# Patient Record
Sex: Male | Born: 1955
Health system: Southern US, Community
[De-identification: ages and names within clinical notes are randomized; demographics above are authoritative.]

## PROBLEM LIST (undated history)

## (undated) DIAGNOSIS — I10 Essential (primary) hypertension: Secondary | ICD-10-CM

## (undated) DIAGNOSIS — F32A Depression, unspecified: Secondary | ICD-10-CM

## (undated) DIAGNOSIS — F329 Major depressive disorder, single episode, unspecified: Secondary | ICD-10-CM

## (undated) DIAGNOSIS — E785 Hyperlipidemia, unspecified: Secondary | ICD-10-CM

## (undated) DIAGNOSIS — J45909 Unspecified asthma, uncomplicated: Secondary | ICD-10-CM

## (undated) DIAGNOSIS — E119 Type 2 diabetes mellitus without complications: Secondary | ICD-10-CM

## (undated) HISTORY — DX: Depression, unspecified: F32.A

## (undated) HISTORY — DX: Unspecified asthma, uncomplicated: J45.909

## (undated) HISTORY — PX: OTHER SURGICAL HISTORY: SHX169

## (undated) HISTORY — PX: LEG SURGERY: SHX1003

## (undated) HISTORY — DX: Major depressive disorder, single episode, unspecified: F32.9

---

## 2001-04-07 ENCOUNTER — Emergency Department (HOSPITAL_COMMUNITY): Admission: EM | Admit: 2001-04-07 | Discharge: 2001-04-07 | Payer: Self-pay | Admitting: Emergency Medicine

## 2001-04-07 ENCOUNTER — Encounter: Payer: Self-pay | Admitting: Emergency Medicine

## 2005-01-14 ENCOUNTER — Emergency Department (HOSPITAL_COMMUNITY): Admission: EM | Admit: 2005-01-14 | Discharge: 2005-01-14 | Payer: Self-pay | Admitting: Emergency Medicine

## 2008-10-08 ENCOUNTER — Emergency Department (HOSPITAL_COMMUNITY): Admission: EM | Admit: 2008-10-08 | Discharge: 2008-10-08 | Payer: Self-pay | Admitting: Emergency Medicine

## 2010-01-24 ENCOUNTER — Emergency Department (HOSPITAL_COMMUNITY): Admission: EM | Admit: 2010-01-24 | Discharge: 2010-01-24 | Payer: Self-pay | Admitting: Family Medicine

## 2010-04-04 ENCOUNTER — Encounter (INDEPENDENT_AMBULATORY_CARE_PROVIDER_SITE_OTHER): Payer: Self-pay | Admitting: *Deleted

## 2010-04-04 LAB — CONVERTED CEMR LAB
ALT: 11 units/L (ref 0–53)
AST: 15 units/L (ref 0–37)
Albumin: 4.4 g/dL (ref 3.5–5.2)
Alkaline Phosphatase: 82 units/L (ref 39–117)
BUN: 11 mg/dL (ref 6–23)
Basophils Absolute: 0 10*3/uL (ref 0.0–0.1)
Basophils Relative: 0 % (ref 0–1)
CO2: 29 meq/L (ref 19–32)
Calcium: 9.2 mg/dL (ref 8.4–10.5)
Chloride: 103 meq/L (ref 96–112)
Cholesterol: 165 mg/dL (ref 0–200)
Creatinine, Ser: 0.96 mg/dL (ref 0.40–1.50)
Eosinophils Absolute: 0.1 10*3/uL (ref 0.0–0.7)
Eosinophils Relative: 1 % (ref 0–5)
Folate: 10.3 ng/mL
Glucose, Bld: 66 mg/dL — ABNORMAL LOW (ref 70–99)
HCT: 44 % (ref 39.0–52.0)
HDL: 48 mg/dL (ref >39)
Hemoglobin: 14.1 g/dL (ref 13.0–17.0)
LDL Cholesterol: 103 mg/dL — ABNORMAL HIGH (ref 0–99)
Lymphocytes Relative: 17 % (ref 12–46)
Lymphs Abs: 1.5 10*3/uL (ref 0.7–4.0)
MCHC: 32 g/dL (ref 30.0–36.0)
MCV: 92.2 fL (ref 78.0–100.0)
Magnesium: 2.3 mg/dL (ref 1.5–2.5)
Monocytes Absolute: 0.7 10*3/uL (ref 0.1–1.0)
Monocytes Relative: 8 % (ref 3–12)
Neutro Abs: 6.4 10*3/uL (ref 1.7–7.7)
Neutrophils Relative %: 74 % (ref 43–77)
Platelets: 335 10*3/uL (ref 150–400)
Potassium: 4 meq/L (ref 3.5–5.3)
RBC: 4.77 M/uL (ref 4.22–5.81)
RDW: 13.5 % (ref 11.5–15.5)
Sodium: 143 meq/L (ref 135–145)
TSH: 0.435 microintl units/mL (ref 0.350–4.500)
Total Bilirubin: 0.4 mg/dL (ref 0.3–1.2)
Total CHOL/HDL Ratio: 3.4
Total Protein: 6.9 g/dL (ref 6.0–8.3)
Triglycerides: 69 mg/dL (ref <150)
VLDL: 14 mg/dL (ref 0–40)
Vitamin B-12: 356 pg/mL (ref 211–911)
WBC: 8.7 10*3/uL (ref 4.0–10.5)

## 2010-06-24 ENCOUNTER — Encounter (INDEPENDENT_AMBULATORY_CARE_PROVIDER_SITE_OTHER): Payer: Self-pay | Admitting: *Deleted

## 2010-06-24 LAB — CONVERTED CEMR LAB
ALT: 26 units/L (ref 0–53)
AST: 24 units/L (ref 0–37)
Albumin: 4.6 g/dL (ref 3.5–5.2)
Alkaline Phosphatase: 64 units/L (ref 39–117)
BUN: 11 mg/dL (ref 6–23)
Basophils Absolute: 0 10*3/uL (ref 0.0–0.1)
Basophils Relative: 0 % (ref 0–1)
CO2: 25 meq/L (ref 19–32)
Calcium: 9.7 mg/dL (ref 8.4–10.5)
Chloride: 101 meq/L (ref 96–112)
Creatinine, Ser: 0.97 mg/dL (ref 0.40–1.50)
Eosinophils Absolute: 0.1 10*3/uL (ref 0.0–0.7)
Eosinophils Relative: 2 % (ref 0–5)
Folate: >20 ng/mL
Glucose, Bld: 95 mg/dL (ref 70–99)
HCT: 46.6 % (ref 39.0–52.0)
HIV: NONREACTIVE
Hemoglobin: 15.5 g/dL (ref 13.0–17.0)
Lymphocytes Relative: 33 % (ref 12–46)
Lymphs Abs: 1.6 10*3/uL (ref 0.7–4.0)
MCHC: 33.3 g/dL (ref 30.0–36.0)
MCV: 88.3 fL (ref 78.0–100.0)
Magnesium: 2.2 mg/dL (ref 1.5–2.5)
Monocytes Absolute: 0.5 10*3/uL (ref 0.1–1.0)
Monocytes Relative: 11 % (ref 3–12)
Neutro Abs: 2.7 10*3/uL (ref 1.7–7.7)
Neutrophils Relative %: 54 % (ref 43–77)
Platelets: 259 10*3/uL (ref 150–400)
Potassium: 4.4 meq/L (ref 3.5–5.3)
RBC: 5.28 M/uL (ref 4.22–5.81)
RDW: 14.1 % (ref 11.5–15.5)
Sodium: 141 meq/L (ref 135–145)
TSH: 1.397 microintl units/mL (ref 0.350–4.500)
Total Bilirubin: 0.4 mg/dL (ref 0.3–1.2)
Total Protein: 7.4 g/dL (ref 6.0–8.3)
Vitamin B-12: 514 pg/mL (ref 211–911)
WBC: 5 10*3/uL (ref 4.0–10.5)

## 2010-08-26 LAB — CBC
HCT: 43 % (ref 39.0–52.0)
Hemoglobin: 15 g/dL (ref 13.0–17.0)
MCHC: 35 g/dL (ref 30.0–36.0)
MCV: 89.2 fL (ref 78.0–100.0)
Platelets: 231 10*3/uL (ref 150–400)
RBC: 4.82 MIL/uL (ref 4.22–5.81)
RDW: 13.8 % (ref 11.5–15.5)
WBC: 6.9 10*3/uL (ref 4.0–10.5)

## 2010-08-26 LAB — DIFFERENTIAL
Eosinophils Absolute: 0.1 10*3/uL (ref 0.0–0.7)
Eosinophils Relative: 1 % (ref 0–5)
Lymphocytes Relative: 18 % (ref 12–46)
Lymphs Abs: 1.2 10*3/uL (ref 0.7–4.0)
Monocytes Relative: 7 % (ref 3–12)

## 2010-08-26 LAB — POCT I-STAT, CHEM 8
BUN: 8 mg/dL (ref 6–23)
Creatinine, Ser: 1.5 mg/dL (ref 0.4–1.5)
Glucose, Bld: 98 mg/dL (ref 70–99)
Hemoglobin: 15.3 g/dL (ref 13.0–17.0)
TCO2: 24 mmol/L (ref 0–100)

## 2010-10-10 ENCOUNTER — Inpatient Hospital Stay (INDEPENDENT_AMBULATORY_CARE_PROVIDER_SITE_OTHER)
Admission: RE | Admit: 2010-10-10 | Discharge: 2010-10-10 | Disposition: A | Discharge status: Home / Self Care | Payer: Self-pay | Source: Ambulatory Visit | Attending: Family Medicine | Admitting: Family Medicine

## 2010-10-10 DIAGNOSIS — I10 Essential (primary) hypertension: Secondary | ICD-10-CM

## 2010-10-10 DIAGNOSIS — R6889 Other general symptoms and signs: Secondary | ICD-10-CM

## 2013-01-20 ENCOUNTER — Emergency Department (HOSPITAL_COMMUNITY): Payer: No Typology Code available for payment source

## 2013-01-20 ENCOUNTER — Encounter (HOSPITAL_COMMUNITY): Payer: Self-pay | Admitting: *Deleted

## 2013-01-20 ENCOUNTER — Emergency Department (HOSPITAL_COMMUNITY)
Admission: EM | Admit: 2013-01-20 | Discharge: 2013-01-20 | Disposition: A | Discharge status: Home / Self Care | Payer: No Typology Code available for payment source | Attending: Emergency Medicine | Admitting: Emergency Medicine

## 2013-01-20 DIAGNOSIS — S1093XA Contusion of unspecified part of neck, initial encounter: Secondary | ICD-10-CM

## 2013-01-20 DIAGNOSIS — S0003XA Contusion of scalp, initial encounter: Secondary | ICD-10-CM | POA: Insufficient documentation

## 2013-01-20 DIAGNOSIS — Z87891 Personal history of nicotine dependence: Secondary | ICD-10-CM | POA: Insufficient documentation

## 2013-01-20 DIAGNOSIS — E119 Type 2 diabetes mellitus without complications: Secondary | ICD-10-CM | POA: Insufficient documentation

## 2013-01-20 DIAGNOSIS — I10 Essential (primary) hypertension: Secondary | ICD-10-CM | POA: Insufficient documentation

## 2013-01-20 DIAGNOSIS — IMO0002 Reserved for concepts with insufficient information to code with codable children: Secondary | ICD-10-CM | POA: Insufficient documentation

## 2013-01-20 DIAGNOSIS — Y939 Activity, unspecified: Secondary | ICD-10-CM | POA: Insufficient documentation

## 2013-01-20 DIAGNOSIS — Y92410 Unspecified street and highway as the place of occurrence of the external cause: Secondary | ICD-10-CM | POA: Insufficient documentation

## 2013-01-20 DIAGNOSIS — S20219A Contusion of unspecified front wall of thorax, initial encounter: Secondary | ICD-10-CM | POA: Insufficient documentation

## 2013-01-20 DIAGNOSIS — S20229A Contusion of unspecified back wall of thorax, initial encounter: Secondary | ICD-10-CM | POA: Insufficient documentation

## 2013-01-20 HISTORY — DX: Type 2 diabetes mellitus without complications: E11.9

## 2013-01-20 HISTORY — DX: Essential (primary) hypertension: I10

## 2013-01-20 HISTORY — DX: Hyperlipidemia, unspecified: E78.5

## 2013-01-20 MED ORDER — ACETAMINOPHEN 500 MG PO TABS
1000.0000 mg | ORAL_TABLET | Freq: Once | ORAL | Status: AC
  Administered 2013-01-20: 1000 mg via ORAL
  Filled 2013-01-20: qty 2
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
## 2013-01-20 NOTE — ED Notes (Signed)
Patient transported to X-ray 
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
## 2016-09-23 ENCOUNTER — Encounter: Payer: Self-pay | Admitting: Family

## 2016-09-23 ENCOUNTER — Other Ambulatory Visit: Payer: Self-pay | Admitting: Family

## 2016-09-23 ENCOUNTER — Ambulatory Visit (INDEPENDENT_AMBULATORY_CARE_PROVIDER_SITE_OTHER): Payer: Self-pay | Admitting: Family

## 2016-09-23 ENCOUNTER — Telehealth: Payer: Self-pay | Admitting: Family

## 2016-09-23 ENCOUNTER — Other Ambulatory Visit (INDEPENDENT_AMBULATORY_CARE_PROVIDER_SITE_OTHER): Payer: Self-pay

## 2016-09-23 VITALS — BP 126/64 | HR 67 | Temp 98.0°F | Resp 16 | Ht 68.0 in | Wt 210.4 lb

## 2016-09-23 DIAGNOSIS — E782 Mixed hyperlipidemia: Secondary | ICD-10-CM

## 2016-09-23 DIAGNOSIS — E119 Type 2 diabetes mellitus without complications: Secondary | ICD-10-CM

## 2016-09-23 DIAGNOSIS — I10 Essential (primary) hypertension: Secondary | ICD-10-CM | POA: Insufficient documentation

## 2016-09-23 DIAGNOSIS — E785 Hyperlipidemia, unspecified: Secondary | ICD-10-CM

## 2016-09-23 HISTORY — DX: Hyperlipidemia, unspecified: E78.5

## 2016-09-23 HISTORY — DX: Type 2 diabetes mellitus without complications: E11.9

## 2016-09-23 HISTORY — DX: Essential (primary) hypertension: I10

## 2016-09-23 LAB — LIPID PANEL
CHOL/HDL RATIO: 4
Cholesterol: 152 mg/dL (ref 0–200)
HDL: 39.2 mg/dL (ref >39.00)
LDL CALC: 78 mg/dL (ref 0–99)
NONHDL: 113.25
Triglycerides: 176 mg/dL — ABNORMAL HIGH (ref 0.0–149.0)
VLDL: 35.2 mg/dL (ref 0.0–40.0)

## 2016-09-23 LAB — COMPREHENSIVE METABOLIC PANEL
ALBUMIN: 4.2 g/dL (ref 3.5–5.2)
ALK PHOS: 58 U/L (ref 39–117)
ALT: 15 U/L (ref 0–53)
AST: 15 U/L (ref 0–37)
BILIRUBIN TOTAL: 0.3 mg/dL (ref 0.2–1.2)
BUN: 13 mg/dL (ref 6–23)
CALCIUM: 9.6 mg/dL (ref 8.4–10.5)
CO2: 29 mEq/L (ref 19–32)
CREATININE: 1.13 mg/dL (ref 0.40–1.50)
Chloride: 104 mEq/L (ref 96–112)
GFR: 85.05 mL/min (ref >60.00)
Glucose, Bld: 132 mg/dL — ABNORMAL HIGH (ref 70–99)
Potassium: 4.5 mEq/L (ref 3.5–5.1)
Sodium: 139 mEq/L (ref 135–145)
Total Protein: 6.8 g/dL (ref 6.0–8.3)

## 2016-09-23 LAB — HEMOGLOBIN A1C: HEMOGLOBIN A1C: 7.7 % — AB (ref 4.6–6.5)

## 2016-09-23 LAB — MICROALBUMIN / CREATININE URINE RATIO
Creatinine,U: 238.1 mg/dL
MICROALB UR: 1.3 mg/dL (ref 0.0–1.9)
Microalb Creat Ratio: 0.5 mg/g (ref 0.0–30.0)

## 2016-09-23 MED ORDER — ROSUVASTATIN CALCIUM 20 MG PO TABS: 20.0000 mg | ORAL_TABLET | Freq: Every day | ORAL | 2 refills | Status: DC

## 2016-09-23 MED ORDER — AMLODIPINE BESYLATE 10 MG PO TABS: 10.0000 mg | ORAL_TABLET | Freq: Every day | ORAL | 0 refills | Status: DC

## 2016-09-23 MED ORDER — METFORMIN HCL 1000 MG PO TABS: 1000.0000 mg | ORAL_TABLET | Freq: Two times a day (BID) | ORAL | 2 refills | Status: DC

## 2016-09-23 NOTE — Telephone Encounter (Signed)
Lajuan Lines sister, said that pt is no longer on Sertraline 100mg  and all scripts need to be sent to [facility]  Phone number [phone]  Fax number [phone]

## 2016-09-23 NOTE — Patient Instructions (Signed)
Thank you for choosing [facility].  SUMMARY AND INSTRUCTIONS:  Please continue to take your medications as prescribed.  Work on a modified carbohydrate intake avoiding too many processed/sugary foods.   We will check your blood work and make adjustments to your medication.   Please schedule a time for your annual exam at your convenience.    Labs:  Please stop by the lab on the lower level of the building for your blood work. Your results will be released to [place] (or called to you) after review, usually within 72 hours after test completion. If any changes need to be made, you will be notified at that same time.  1.) The lab is open from 7:30am to 5:30 pm Monday-Friday 2.) No appointment is necessary 3.) Fasting (if needed) is 6-8 hours after food and drink; black coffee and water are okay   Follow up:  If your symptoms worsen or fail to improve, please contact our office for further instruction, or in case of emergency go directly to the emergency room at the closest medical facility.   DIABETES CARE INSTRUCTIONS:  Current A1c: No results found for: HGBA1C  A1C Goal: <7.0%  Fasting Blood Sugar Goal: 80-130 Blood sugar check that you take after fasting for at least 8 hours which is usually in the morning before eating.  Post-prandial Blood Sugar Goal: <180 Blood sugar check approximately 1-2 hours after the start of a meal.    Diabetes Prevention Screens:  1.) Ensure you have an annual eye exam by an ophthalmologist or optometrist.   2,) Ensure you have an annual foot exam or when any changes are noted including new onset numbness/tingling or wounds. Check your feet daily!  3.) The American Diabetes Association recommends the Pneumovax vaccination against pneumonia every 5 years.  4.) We will check your kidney function with a urine test on an annual basis.

## 2016-09-23 NOTE — Assessment & Plan Note (Signed)
Obtain lipid profile. Currently maintained on simvastatin with no adverse side effects. Continue current dosage of simvastatin pending lipid profile results.

## 2016-09-23 NOTE — Assessment & Plan Note (Signed)
Type 2 diabetes appears adequately controlled based on blood sugar readings generally less than 150. Denies adverse side effects or hypoglycemic readings. Obtain A1c, urine microalbumin, and CMET. Diabetic foot exam completed today. On simvastatin for CAD risk reduction. Will request records for Pneumovax status. Encouraged to continue to monitor blood sugars at home 1-2 times daily. Follow up and medication changes pending A1c results.

## 2016-09-23 NOTE — Telephone Encounter (Signed)
LVM with sister letting her know that cholesterol med and diabetes meds will not be sent in until we get pts lab results. They were informed during visit that [facility] will not fill those medications until we get blood work. BP meds were sent in.

## 2016-09-23 NOTE — Progress Notes (Signed)
Subjective:    Patient ID: Benjamin Serrano, male    DOB: 14-Feb-1956, 61 y.o.   MRN: [id number]  Chief Complaint  Patient presents with  . Establish Care    Diabetes and cholesterol check     HPI:  Benjamin Serrano is a 61 y.o. male who  has a past medical history of Depression; Diabetes mellitus without complication ([place]); Hyperlipemia; and Hypertension. and presents today for an office visit to establish care. Patient has some mild memory impairment and is a mildly poor historian.   1.) Diabetes - Currently prescribed glipizide and metformin. Reports taking the medication as prescribed and denies adverse side effects or hypoglycemic readings. Blood sugars at home . Denies new symptoms of end organ damage. No excessive hunger, thirst, or urination. Working on a low/carbohydrate modified oral intake. Scheduled for an eye exam.   Lab Results  Component Value Date   HGBA1C 7.7 (H) 09/23/2016     Lab Results  Component Value Date   CREATININE 1.13 09/23/2016   BUN 13 09/23/2016   NA 139 09/23/2016   K 4.5 09/23/2016   CL 104 09/23/2016   CO2 29 09/23/2016    2.) Cholesterol - Currently prescribed simvastatin. Reports taking the medication as prescribed and denies adverse side effects or myalgias. No current cardiac symptoms. Continues to work on improving nutritional quality.   Lab Results  Component Value Date   CHOL 152 09/23/2016   HDL 39.20 09/23/2016   LDLCALC 78 09/23/2016   TRIG 176.0 (H) 09/23/2016   CHOLHDL 4 09/23/2016    3.) Hypertension - Currently maintained on amlodipine  and reports taking the medications as prescribed and denies adverse side effects or hypotensive readings. Blood pressures at home have been adequately controlled. Denies changes in vision, worst headache of life or new symptoms of end organ damage. Working on following a low sodium diet. Physical activity level   BP Readings from Last 3 Encounters:  09/23/16 126/64  01/20/13 147/81     No Known Allergies    Outpatient Medications Prior to Visit  Medication Sig Dispense Refill  . fish oil-omega-3 fatty acids 1000 MG capsule Take 1 g by mouth 2 (two) times daily.    Marland Kitchen glipiZIDE (GLUCOTROL) 10 MG tablet Take 10 mg by mouth daily.     No facility-administered medications prior to visit.      Past Medical History:  Diagnosis Date  . Depression   . Diabetes mellitus without complication ([place])   . Hyperlipemia   . Hypertension       Past Surgical History:  Procedure Laterality Date  . Arm surgery Right   . LEG SURGERY        Family History  Problem Relation Age of Onset  . Diabetes Mother   . Alzheimer's disease Maternal Grandmother   . Hypertension Maternal Grandmother       Social History   Social History  . Marital status: Widowed    Spouse name: N/A  . Number of children: 1  . Years of education: 9   Occupational History  . Disability     Mental health   Social History Main Topics  . Smoking status: Current Every Day Smoker    Packs/day: 0.25    Types: Cigarettes  . Smokeless tobacco: Never Used  . Alcohol use No  . Drug use: No  . Sexual activity: Not on file   Other Topics Concern  . Not on file   Social History Narrative  Fun/Hobby: Paint and art.       Review of Systems  Constitutional: Negative for chills and fever.  Eyes:       Negative for changes in vision  Respiratory: Negative for cough, chest tightness, shortness of breath and wheezing.   Cardiovascular: Negative for chest pain, palpitations and leg swelling.  Endocrine: Negative for polydipsia, polyphagia and polyuria.  Neurological: Negative for dizziness, weakness, light-headedness and headaches.       Objective:    BP 126/64 (BP Location: Left Arm, Patient Position: Sitting, Cuff Size: Large)   Pulse 67   Temp 98 F (36.7 C) (Oral)   Resp 16   Ht 5\' 8"  (1.727 m)   Wt 210 lb 6.4 oz (95.4 kg)   SpO2 96%   BMI 31.99 kg/m  Nursing note and  vital signs reviewed.  Physical Exam  Constitutional: He is oriented to person, place, and time. He appears well-developed and well-nourished. No distress.  Cardiovascular: Normal rate, regular rhythm, normal heart sounds and intact distal pulses.   Pulmonary/Chest: Effort normal and breath sounds normal.  Neurological: He is alert and oriented to person, place, and time.  Skin: Skin is warm and dry.  Psychiatric: He has a normal mood and affect. His speech is normal and behavior is normal. Judgment and thought content normal. Cognition and memory are impaired.        Assessment & Plan:   Problem List Items Addressed This Visit      Cardiovascular and Mediastinum   Essential hypertension    Blood pressure appears adequately controlled with current medication regimen below goal 140/90 with no adverse side effects or hypotensive readings. Denies worse headache of life with no symptoms of end organ damage noted on physical exam. Continue current dosage of amlodipine. Encouraged to monitor blood pressure at home and follow sodium diet.      Relevant Medications   simvastatin (ZOCOR) 40 MG tablet   amLODipine (NORVASC) 10 MG tablet     Endocrine   Type 2 diabetes mellitus (HCC) - Primary    Type 2 diabetes appears adequately controlled based on blood sugar readings generally less than 150. Denies adverse side effects or hypoglycemic readings. Obtain A1c, urine microalbumin, and CMET. Diabetic foot exam completed today. On simvastatin for CAD risk reduction. Will request records for Pneumovax status. Encouraged to continue to monitor blood sugars at home 1-2 times daily. Follow up and medication changes pending A1c results.       Relevant Medications   metFORMIN (GLUCOPHAGE) 500 MG tablet   simvastatin (ZOCOR) 40 MG tablet   Other Relevant Orders   Comprehensive metabolic panel (Completed)   Hemoglobin A1c (Completed)   Urine Microalbumin w/creat. ratio (Completed)     Other    Hyperlipidemia    Obtain lipid profile. Currently maintained on simvastatin with no adverse side effects. Continue current dosage of simvastatin pending lipid profile results.      Relevant Medications   simvastatin (ZOCOR) 40 MG tablet   amLODipine (NORVASC) 10 MG tablet   Other Relevant Orders   Lipid Profile (Completed)       I have changed Mr. Taney's amLODipine. I am also having him maintain his glipiZIDE, fish oil-omega-3 fatty acids, metFORMIN, and simvastatin.   Meds ordered this encounter  Medications  . metFORMIN (GLUCOPHAGE) 500 MG tablet    Sig: Take 500 mg by mouth 2 (two) times daily with a meal.  . DISCONTD: amLODipine (NORVASC) 10 MG tablet    Sig: Take  10 mg by mouth daily.  Marland Kitchen DISCONTD: sertraline (ZOLOFT) 100 MG tablet    Sig: Take 100 mg by mouth daily.  . simvastatin (ZOCOR) 40 MG tablet    Sig: Take 40 mg by mouth daily.  Marland Kitchen amLODipine (NORVASC) 10 MG tablet    Sig: Take 1 tablet (10 mg total) by mouth daily.    Dispense:  90 tablet    Refill:  0     Follow-up: Return in about 1 month (around 10/24/2016), or if symptoms worsen or fail to improve.  Mauricio Po, FNP

## 2016-09-23 NOTE — Telephone Encounter (Signed)
Pt wanted to let us know that he is completely out of simvastatin (ZOCOR) 40 MG tablet and needs this to be sent as soon as possible.

## 2016-09-23 NOTE — Assessment & Plan Note (Signed)
Blood pressure appears adequately controlled with current medication regimen below goal 140/90 with no adverse side effects or hypotensive readings. Denies worse headache of life with no symptoms of end organ damage noted on physical exam. Continue current dosage of amlodipine. Encouraged to monitor blood pressure at home and follow sodium diet.

## 2016-09-24 ENCOUNTER — Telehealth: Payer: Self-pay | Admitting: Family

## 2016-09-24 NOTE — Telephone Encounter (Signed)
Pt aware of results 

## 2016-09-24 NOTE — Telephone Encounter (Signed)
Please call back with test results.

## 2016-11-30 ENCOUNTER — Other Ambulatory Visit: Payer: Self-pay | Admitting: Family

## 2016-11-30 ENCOUNTER — Telehealth: Payer: Self-pay | Admitting: Family

## 2016-11-30 MED ORDER — GLUCOSE BLOOD VI STRP: ORAL_STRIP | 12 refills | Status: DC

## 2016-11-30 MED ORDER — LANCETS MISC: 2 refills | Status: DC

## 2016-11-30 NOTE — Telephone Encounter (Signed)
Requesting scripts for lancets and test strips to be sent to [facility] on [address].  Sister will call back with info on meter.

## 2016-11-30 NOTE — Telephone Encounter (Signed)
Test strips and lancets have been sent  

## 2016-11-30 NOTE — Telephone Encounter (Signed)
The name of the meter True Matrix Meprix

## 2017-02-01 ENCOUNTER — Other Ambulatory Visit: Payer: Self-pay | Admitting: Family

## 2017-02-01 NOTE — Telephone Encounter (Signed)
Pt sister called regarding this.

## 2017-02-02 ENCOUNTER — Telehealth: Payer: Self-pay | Admitting: Family

## 2017-02-02 MED ORDER — GLUCOSE BLOOD VI STRP: 1.0000 | ORAL_STRIP | Freq: Two times a day (BID) | 0 refills | Status: DC

## 2017-02-02 NOTE — Telephone Encounter (Signed)
Pt is needing a refill on his glucose blood (TRUE METRIX BLOOD GLUCOSE TEST) test strips sent to [facility] on [address].

## 2017-02-02 NOTE — Telephone Encounter (Signed)
Pt is due for follow-up appt sent 30 day script until appt...Benjamin Serrano

## 2017-02-03 NOTE — Telephone Encounter (Signed)
Pt called back in said that pharmacy never got his med?  Can this be resent?

## 2017-02-04 ENCOUNTER — Telehealth: Payer: Self-pay | Admitting: Family

## 2017-02-04 MED ORDER — GLUCOSE BLOOD VI STRP: 1.0000 | ORAL_STRIP | Freq: Two times a day (BID) | 0 refills | Status: DC

## 2017-02-04 NOTE — Addendum Note (Signed)
Addended by: Earnstine Regal on: 02/04/2017 09:33 AM   Modules accepted: Orders

## 2017-02-04 NOTE — Telephone Encounter (Signed)
Resent rx for strips to [facility]...Benjamin Serrano

## 2017-02-04 NOTE — Telephone Encounter (Signed)
States testing strips was to have some kind of paper work filled out with insurance in order to get filled.  Insurance company states wrong date was not right - should have been 12/16/2016 and there was something not right with A1C section.  Is requesting call back in regard to this.

## 2017-02-04 NOTE — Telephone Encounter (Signed)
I have added a1c number and date changed to 12/16/16---refaxed again to [facility], calling to confirm receipt

## 2017-02-04 NOTE — Telephone Encounter (Signed)
Per [facility] pharmacist, paper has been recd via fax with all right information---cost for patient is $3---I have advised patient's daughter that strips are ready for pick up per [facility] pharmacy

## 2017-02-22 ENCOUNTER — Other Ambulatory Visit (INDEPENDENT_AMBULATORY_CARE_PROVIDER_SITE_OTHER): Payer: Medicare Other

## 2017-02-22 ENCOUNTER — Encounter: Payer: Self-pay | Admitting: Family

## 2017-02-22 ENCOUNTER — Ambulatory Visit (INDEPENDENT_AMBULATORY_CARE_PROVIDER_SITE_OTHER): Payer: Medicare Other | Admitting: Family

## 2017-02-22 VITALS — BP 112/72 | HR 63 | Temp 98.4°F | Resp 16 | Ht 68.0 in | Wt 199.4 lb

## 2017-02-22 DIAGNOSIS — E119 Type 2 diabetes mellitus without complications: Secondary | ICD-10-CM

## 2017-02-22 DIAGNOSIS — E782 Mixed hyperlipidemia: Secondary | ICD-10-CM | POA: Diagnosis not present

## 2017-02-22 DIAGNOSIS — I10 Essential (primary) hypertension: Secondary | ICD-10-CM | POA: Diagnosis not present

## 2017-02-22 LAB — HEMOGLOBIN A1C: HEMOGLOBIN A1C: 7.5 % — AB (ref 4.6–6.5)

## 2017-02-22 MED ORDER — METFORMIN HCL 1000 MG PO TABS: 1000.0000 mg | ORAL_TABLET | Freq: Two times a day (BID) | ORAL | 1 refills | Status: DC

## 2017-02-22 MED ORDER — AMLODIPINE BESYLATE 10 MG PO TABS: 10.0000 mg | ORAL_TABLET | Freq: Every day | ORAL | 1 refills | Status: DC

## 2017-02-22 MED ORDER — GLIPIZIDE 10 MG PO TABS: 10.0000 mg | ORAL_TABLET | Freq: Every day | ORAL | 1 refills | Status: DC

## 2017-02-22 MED ORDER — ROSUVASTATIN CALCIUM 20 MG PO TABS: 20.0000 mg | ORAL_TABLET | Freq: Every day | ORAL | 1 refills | Status: DC

## 2017-02-22 MED ORDER — GLUCOSE BLOOD VI STRP: 1.0000 | ORAL_STRIP | Freq: Two times a day (BID) | 3 refills | Status: DC

## 2017-02-22 NOTE — Assessment & Plan Note (Signed)
Blood pressure appears well controlled and below goal 140/90 with current medication regimen and no adverse side effects. No symptoms of end organ damage noted on physical exam. Continue current dosage of amlodipine. Encouraged to continue monitoring blood pressure at home and follow low-sodium diet.

## 2017-02-22 NOTE — Patient Instructions (Signed)
Thank you for choosing [facility].  SUMMARY AND INSTRUCTIONS:  Please continue to take your medications as prescribed.   Continue to monitor your blood sugars at home 2x daily.   Follow a low sodium diet and monitor your blood pressure at home daily. Average goal below 140/90.  Folllow up pending your A1c results in 3-6 months.   Medication:  Your prescription(s) have been submitted to your pharmacy or been printed and provided for you. Please take as directed and contact our office if you believe you are having problem(s) with the medication(s) or have any questions.  Labs:  Please stop by the lab on the lower level of the building for your blood work. Your results will be released to [facility] (or called to you) after review, usually within 72 hours after test completion. If any changes need to be made, you will be notified at that same time.  1.) The lab is open from 7:30am to 5:30 pm Monday-Friday 2.) No appointment is necessary 3.) Fasting (if needed) is 6-8 hours after food and drink; black coffee and water are okay   Follow up:  If your symptoms worsen or fail to improve, please contact our office for further instruction, or in case of emergency go directly to the emergency room at the closest medical facility.

## 2017-02-22 NOTE — Progress Notes (Signed)
Subjective:    Patient ID: Benjamin Serrano, male    DOB: [birth year], 61 y.o.   MRN: [id number]  Chief Complaint  Patient presents with  . Medication Refill    refills on all meds    HPI:  Benjamin Serrano is a 61 y.o. male who  has a past medical history of Depression; Diabetes mellitus without complication ([place]); Hyperlipemia; and Hypertension. and presents today for a follow up office visit.    1.) Type 2 diabetes - Currently prescribed glipizide and metformin. Reports taking the medication as prescribed and denies adverse side effects or hypoglycemic readings. Blood sugars at home have been labile  . Denies new symptoms of end organ damage. No excessive hunger, thirst, or urination. Working on a low/carbohydrate modified oral intake. Physical activity  Lab Results  Component Value Date   HGBA1C 7.5 (H) 02/22/2017     Lab Results  Component Value Date   CREATININE 1.13 09/23/2016   BUN 13 09/23/2016   NA 139 09/23/2016   K 4.5 09/23/2016   CL 104 09/23/2016   CO2 29 09/23/2016   2.) Hypertension - Currently prescribed amlodipine. Reports taking the medications as prescribed and denies adverse side effects or hypotensive readings. Blood pressures at home have remained below goal and well controlled. Denies changes in vision, worst headache of life or new symptoms of end organ damage. Working on following a low sodium diet.   BP Readings from Last 3 Encounters:  02/22/17 112/72  09/23/16 126/64  01/20/13 147/81    3.) Hyperlipidemia - Currently prescribed rosuvastain. Reports taking the medication as prescribed and denies adverse side effects or myalgias.   Lab Results  Component Value Date   CHOL 152 09/23/2016   HDL 39.20 09/23/2016   LDLCALC 78 09/23/2016   TRIG 176.0 (H) 09/23/2016   CHOLHDL 4 09/23/2016      No Known Allergies    Outpatient Medications Prior to Visit  Medication Sig Dispense Refill  . fish oil-omega-3 fatty acids 1000 MG capsule  Take 1 g by mouth 2 (two) times daily.    . Lancets MISC Use to check blood sugars 1-4 times daily. 100 each 2  . amLODipine (NORVASC) 10 MG tablet Take 1 tablet (10 mg total) by mouth daily. 90 tablet 0  . glipiZIDE (GLUCOTROL) 10 MG tablet Take 10 mg by mouth daily.    Marland Kitchen glucose blood (TRUE METRIX BLOOD GLUCOSE TEST) test strip 1 each by Other route 2 (two) times daily. Follow-up appt is due must see provider for future refills 100 each 0  . metFORMIN (GLUCOPHAGE) 1000 MG tablet TAKE 1 TABLET BY MOUTH TWICE DAILY WITH MEALS 60 tablet 0  . rosuvastatin (CRESTOR) 20 MG tablet TAKE 1 TABLET BY MOUTH DAILY 30 tablet 2  . metFORMIN (GLUCOPHAGE) 1000 MG tablet Take 1 tablet by mouth twice daily with meals. Needs office visit for more refills. 60 tablet 0   No facility-administered medications prior to visit.       Past Surgical History:  Procedure Laterality Date  . Arm surgery Right   . LEG SURGERY        Past Medical History:  Diagnosis Date  . Depression   . Diabetes mellitus without complication ([place])   . Hyperlipemia   . Hypertension       Review of Systems  Constitutional: Negative for chills and fever.  Eyes:       Negative for changes in vision  Respiratory: Negative for cough, chest  tightness, shortness of breath and wheezing.   Cardiovascular: Negative for chest pain, palpitations and leg swelling.  Endocrine: Negative for polydipsia, polyphagia and polyuria.  Neurological: Negative for dizziness, weakness, light-headedness and headaches.      Objective:    BP 112/72 (BP Location: Left Arm, Patient Position: Sitting, Cuff Size: Large)   Pulse 63   Temp 98.4 F (36.9 C) (Oral)   Resp 16   Ht 5\' 8"  (1.727 m)   Wt 199 lb 6.4 oz (90.4 kg)   SpO2 96%   BMI 30.32 kg/m  Nursing note and vital signs reviewed.  Physical Exam  Constitutional: He is oriented to person, place, and time. He appears well-developed and well-nourished. No distress.  Cardiovascular:  Normal rate, regular rhythm, normal heart sounds and intact distal pulses.  Exam reveals no gallop and no friction rub.   No murmur heard. Pulmonary/Chest: Effort normal and breath sounds normal. No respiratory distress. He has no wheezes. He has no rales. He exhibits no tenderness.  Neurological: He is alert and oriented to person, place, and time.  Skin: Skin is warm and dry.  Psychiatric: He has a normal mood and affect. His behavior is normal. Judgment and thought content normal.       Assessment & Plan:   Problem List Items Addressed This Visit      Cardiovascular and Mediastinum   Essential hypertension    Blood pressure appears well controlled and below goal 140/90 with current medication regimen and no adverse side effects. No symptoms of end organ damage noted on physical exam. Continue current dosage of amlodipine. Encouraged to continue monitoring blood pressure at home and follow low-sodium diet.      Relevant Medications   amLODipine (NORVASC) 10 MG tablet   rosuvastatin (CRESTOR) 20 MG tablet     Endocrine   Type 2 diabetes mellitus ([place]) - Primary    Home blood sugars appear stable with current medication regimen and no adverse side effects or hypoglycemic readings. Obtain hemoglobin A1c. Declines Pneumovax. Encouraged to complete diabetic eye exam independently. Urine microalbumin and diabetic foot exam completed. Maintained on rosuvastatin for CAD risk reduction. Continue current dosage of metformin and glipizide pending A1c results.      Relevant Medications   metFORMIN (GLUCOPHAGE) 1000 MG tablet   glipiZIDE (GLUCOTROL) 10 MG tablet   rosuvastatin (CRESTOR) 20 MG tablet   Other Relevant Orders   Hemoglobin A1c (Completed)     Other   Hyperlipidemia    Stable with current medication regimen and no adverse side effects or myalgias. Most recent lipid profile shows LDL of 78. Continue current dosage of rosuvastatin.      Relevant Medications   amLODipine  (NORVASC) 10 MG tablet   rosuvastatin (CRESTOR) 20 MG tablet       I have discontinued Benjamin Serrano metFORMIN. I have also changed his glucose blood, metFORMIN, glipiZIDE, and rosuvastatin. Additionally, I am having him maintain his fish oil-omega-3 fatty acids, Lancets, and amLODipine.   Meds ordered this encounter  Medications  . glucose blood (TRUE METRIX BLOOD GLUCOSE TEST) test strip    Sig: 1 each by Other route 2 (two) times daily.    Dispense:  100 each    Refill:  3  . metFORMIN (GLUCOPHAGE) 1000 MG tablet    Sig: Take 1 tablet (1,000 mg total) by mouth 2 (two) times daily with a meal.    Dispense:  180 tablet    Refill:  1    Order Specific  Question:   Supervising Provider    Answer:   Pricilla Holm A [[id number]]  . glipiZIDE (GLUCOTROL) 10 MG tablet    Sig: Take 1 tablet (10 mg total) by mouth daily.    Dispense:  90 tablet    Refill:  1    Order Specific Question:   Supervising Provider    Answer:   Pricilla Holm A [[id number]]  . amLODipine (NORVASC) 10 MG tablet    Sig: Take 1 tablet (10 mg total) by mouth daily.    Dispense:  90 tablet    Refill:  1    Order Specific Question:   Supervising Provider    Answer:   Pricilla Holm A [[id number]]  . rosuvastatin (CRESTOR) 20 MG tablet    Sig: Take 1 tablet (20 mg total) by mouth daily.    Dispense:  90 tablet    Refill:  1    Order Specific Question:   Supervising Provider    Answer:   Pricilla Holm A [[id number]]     Follow-up: Return in about 3 months (around 05/25/2017), or if symptoms worsen or fail to improve.  Benjamin Po, FNP

## 2017-02-22 NOTE — Assessment & Plan Note (Signed)
Home blood sugars appear stable with current medication regimen and no adverse side effects or hypoglycemic readings. Obtain hemoglobin A1c. Declines Pneumovax. Encouraged to complete diabetic eye exam independently. Urine microalbumin and diabetic foot exam completed. Maintained on rosuvastatin for CAD risk reduction. Continue current dosage of metformin and glipizide pending A1c results.

## 2017-02-22 NOTE — Assessment & Plan Note (Signed)
Stable with current medication regimen and no adverse side effects or myalgias. Most recent lipid profile shows LDL of 78. Continue current dosage of rosuvastatin.

## 2017-02-23 ENCOUNTER — Telehealth: Payer: Self-pay

## 2017-02-23 NOTE — Telephone Encounter (Signed)
Patient wants to transfer to [facility]. Will you call him to set up a 3 month follow up for diabetes with her please.

## 2017-02-24 NOTE — Telephone Encounter (Signed)
Got patient scheduled

## 2017-04-23 ENCOUNTER — Other Ambulatory Visit: Payer: Self-pay

## 2017-04-23 MED ORDER — GLUCOSE BLOOD VI STRP: 1.0000 | ORAL_STRIP | Freq: Two times a day (BID) | 5 refills | Status: DC

## 2017-05-07 ENCOUNTER — Telehealth: Payer: Self-pay | Admitting: Family

## 2017-05-07 MED ORDER — GLUCOSE BLOOD VI STRP: 1.0000 | ORAL_STRIP | Freq: Two times a day (BID) | 0 refills | Status: DC

## 2017-05-07 NOTE — Telephone Encounter (Signed)
Copied from [place] [id number]. Topic: Quick Communication - See Telephone Encounter >> May 07, 2017  2:04 PM Percell Belt A wrote: CRM for notification. See Telephone encounter for:  05/07/17. Pt sister called in and said that pt needs refill on his  glucose blood (TRUE METRIX BLOOD GLUCOSE TEST) test strip [[id number]]   Please sent to [facility] on [address]  She is pretty sure it is going to need a PA

## 2017-05-07 NOTE — Telephone Encounter (Signed)
SENT RX POF...Benjamin Serrano

## 2017-05-28 ENCOUNTER — Other Ambulatory Visit (INDEPENDENT_AMBULATORY_CARE_PROVIDER_SITE_OTHER): Payer: Medicare HMO

## 2017-05-28 ENCOUNTER — Ambulatory Visit (INDEPENDENT_AMBULATORY_CARE_PROVIDER_SITE_OTHER): Payer: Medicare HMO | Admitting: Nurse Practitioner

## 2017-05-28 ENCOUNTER — Encounter: Payer: Self-pay | Admitting: Nurse Practitioner

## 2017-05-28 VITALS — BP 140/80 | HR 75 | Temp 98.4°F | Resp 16 | Ht 68.0 in | Wt 206.1 lb

## 2017-05-28 DIAGNOSIS — E119 Type 2 diabetes mellitus without complications: Secondary | ICD-10-CM

## 2017-05-28 DIAGNOSIS — N529 Male erectile dysfunction, unspecified: Secondary | ICD-10-CM | POA: Diagnosis not present

## 2017-05-28 DIAGNOSIS — I10 Essential (primary) hypertension: Secondary | ICD-10-CM | POA: Diagnosis not present

## 2017-05-28 DIAGNOSIS — H938X3 Other specified disorders of ear, bilateral: Secondary | ICD-10-CM | POA: Diagnosis not present

## 2017-05-28 DIAGNOSIS — Z125 Encounter for screening for malignant neoplasm of prostate: Secondary | ICD-10-CM | POA: Diagnosis not present

## 2017-05-28 DIAGNOSIS — R3912 Poor urinary stream: Secondary | ICD-10-CM

## 2017-05-28 DIAGNOSIS — E782 Mixed hyperlipidemia: Secondary | ICD-10-CM

## 2017-05-28 LAB — COMPREHENSIVE METABOLIC PANEL
ALK PHOS: 65 U/L (ref 39–117)
ALT: 13 U/L (ref 0–53)
AST: 14 U/L (ref 0–37)
Albumin: 4.6 g/dL (ref 3.5–5.2)
BUN: 13 mg/dL (ref 6–23)
CO2: 30 meq/L (ref 19–32)
Calcium: 9.5 mg/dL (ref 8.4–10.5)
Chloride: 102 mEq/L (ref 96–112)
Creatinine, Ser: 0.98 mg/dL (ref 0.40–1.50)
GFR: 100.01 mL/min (ref >60.00)
GLUCOSE: 100 mg/dL — AB (ref 70–99)
POTASSIUM: 4 meq/L (ref 3.5–5.1)
SODIUM: 140 meq/L (ref 135–145)
Total Bilirubin: 0.4 mg/dL (ref 0.2–1.2)
Total Protein: 7.2 g/dL (ref 6.0–8.3)

## 2017-05-28 LAB — LIPID PANEL
CHOL/HDL RATIO: 3
Cholesterol: 134 mg/dL (ref 0–200)
HDL: 42.6 mg/dL (ref >39.00)
LDL CALC: 59 mg/dL (ref 0–99)
NONHDL: 91.02
Triglycerides: 158 mg/dL — ABNORMAL HIGH (ref 0.0–149.0)
VLDL: 31.6 mg/dL (ref 0.0–40.0)

## 2017-05-28 LAB — HEMOGLOBIN A1C: Hgb A1c MFr Bld: 7.2 % — ABNORMAL HIGH (ref 4.6–6.5)

## 2017-05-28 LAB — PSA: PSA: 2.73 ng/mL (ref 0.10–4.00)

## 2017-05-28 MED ORDER — GLIPIZIDE 10 MG PO TABS: 10.0000 mg | ORAL_TABLET | Freq: Every day | ORAL | 1 refills | Status: DC

## 2017-05-28 MED ORDER — METFORMIN HCL 1000 MG PO TABS: 1000.0000 mg | ORAL_TABLET | Freq: Two times a day (BID) | ORAL | 1 refills | Status: DC

## 2017-05-28 NOTE — Progress Notes (Addendum)
Subjective:    Patient ID: Benjamin Serrano, male    DOB: [birth year], 62 y.o.   MRN: [id number]  HPI Benjamin Serrano is a 62 yo male who presents today to establish care. He is accompanied today by his mother, who he lives with. He is transferring to me from another provider in the same clinic. Has the following chronic medical problems: hypertension, diabetes mellitus type 2, hyperlipidemia.   Hypertension -maintained on amlodipine 10 Reports daily medication compliance without adverse medication effects. Reports he does not check his blood pressure regularly at home Denies headaches, vision changes, chest pain, shortness of breath, edema.  BP Readings from Last 3 Encounters:  05/28/17 140/80  02/22/17 112/72  09/23/16 126/64   Cholesterol- maintained on rosuvastatin 20 Reports daily medication compliance without adverse medication effects including myalgias. Diet- home cooked meals, vegetables, baked meats Drinks- ginger ale, large jug of water daily Exercise- walks outside about 1 hour about 3 days a week  Lab Results  Component Value Date   CHOL 152 09/23/2016   HDL 39.20 09/23/2016   LDLCALC 78 09/23/2016   TRIG 176.0 (H) 09/23/2016   CHOLHDL 4 09/23/2016   Diabetes- maintained on metformin 1000 BID, glipizide 10 daily Reports daily medication compliance without adverse medication effects. Reports he checks his blood sugars twice daily at home- readings 90s-160. Denies tremor, diaphoresis, polyuria, polydipsia, polyphagia.  Lab Results  Component Value Date   HGBA1C 7.5 (H) 02/22/2017   Erectile dysfunction- This Is not a new problem. This problem has been ongoing for some time now, possibly a year or more This is his first encounter for this problem. He is unable to get an erection and feels his penis is too small. He has not tried to have intercourse because he is too embarrassed. He has the desire to have sex but is too afraid to try. He reports weakened urinary  stream, urinary freuquency. He denies dizziness, weakness, abdominal pain, pelvic pain, dysuria, hematuria, penile discharge, constipation or diarrhea.   Ear pressure- This is an acute problem. This problem began about a week ago. He describes as pressure and popping in his ears. He feels like he is in a tunnel and he can't hear good when his ears pop. He reports nasal congestion. He denies fevers, ear pain or discharge, vision changes, sore throat.  Review of Systems  See HPI  Past Medical History:  Diagnosis Date  . Depression   . Diabetes mellitus without complication ([place])   . Hyperlipemia   . Hypertension      Social History   Socioeconomic History  . Marital status: Widowed    Spouse name: Not on file  . Number of children: 1  . Years of education: 50  . Highest education level: Not on file  Social Needs  . Financial resource strain: Not on file  . Food insecurity - worry: Not on file  . Food insecurity - inability: Not on file  . Transportation needs - medical: Not on file  . Transportation needs - non-medical: Not on file  Occupational History  . Occupation: Disability    Comment: Mental health  Tobacco Use  . Smoking status: Current Every Day Smoker    Packs/day: 0.25    Types: Cigarettes  . Smokeless tobacco: Never Used  Substance and Sexual Activity  . Alcohol use: No    Comment: social  . Drug use: No  . Sexual activity: Not on file  Other Topics Concern  . Not on  file  Social History Narrative   Fun/Hobby: Oceanographer.     Past Surgical History:  Procedure Laterality Date  . Arm surgery Right   . LEG SURGERY      Family History  Problem Relation Age of Onset  . Diabetes Mother   . Alzheimer's disease Maternal Grandmother   . Hypertension Maternal Grandmother     No Known Allergies  Current Outpatient Medications on File Prior to Visit  Medication Sig Dispense Refill  . amLODipine (NORVASC) 10 MG tablet Take 1 tablet (10 mg total)  by mouth daily. 90 tablet 1  . fish oil-omega-3 fatty acids 1000 MG capsule Take 1 g by mouth 2 (two) times daily.    Marland Kitchen glucose blood (TRUE METRIX BLOOD GLUCOSE TEST) test strip 1 each by Other route 2 (two) times daily. Must keep schedule;ed appt w/new provider for refills 100 each 0  . Lancets MISC Use to check blood sugars 1-4 times daily. 100 each 2  . rosuvastatin (CRESTOR) 20 MG tablet Take 1 tablet (20 mg total) by mouth daily. 90 tablet 1   No current facility-administered medications on file prior to visit.     BP 140/80   Pulse 75   Temp 98.4 F (36.9 C) (Oral)   Resp 16   Ht 5\' 8"  (1.727 m)   Wt 206 lb 1.9 oz (93.5 kg)   SpO2 98%   BMI 31.34 kg/m      Objective:   Physical Exam  Constitutional: He is oriented to person, place, and time. He appears well-developed and well-nourished. No distress.  HENT:  Head: Normocephalic and atraumatic.  Right Ear: External ear and ear canal normal. No drainage, swelling or tenderness. Tympanic membrane is not erythematous. A middle ear effusion is present.  Left Ear: Tympanic membrane, external ear and ear canal normal. No drainage, swelling or tenderness. Tympanic membrane is not erythematous.  Nose: Nose normal.  Mouth/Throat: Uvula is midline, oropharynx is clear and moist and mucous membranes are normal.  Neck: Neck supple. No tracheal deviation present.  Cardiovascular: Normal rate, regular rhythm, normal heart sounds and intact distal pulses.  Pulmonary/Chest: Effort normal and breath sounds normal. No respiratory distress.  Abdominal: Soft. He exhibits no distension.  Musculoskeletal: He exhibits no edema.  Lymphadenopathy:    He has no cervical adenopathy.  Neurological: He is alert and oriented to person, place, and time. Coordination normal.  Skin: Skin is warm and dry.  Psychiatric: He has a normal mood and affect. Judgment and thought content normal.      Assessment & Plan:  RTC in 3 months for follow up of chronic  problems- we will see how he doing after urology visit and if his ears are better.   Weak urinary stream Diagnostic testing ordered: - PSA; Future Referrals ordered: - Ambulatory referral to Urology  Pressure sensation in both ears Visible fluid behind his right TM, no fevers or discomfort. He does have nasal congestion. Will trial a course of flonase for one week- instructions given and he will purchase OTC. Return precautions given. Will consider additional treatment options if needed.

## 2017-05-28 NOTE — Patient Instructions (Addendum)
Please head downstairs for lab work.  I have placed a referral to urology . Our office will call you to schedule this appointment. You should hear from our office in 7-10 days.  Remember, keeping your blood sugars below 154 consistently will get your A1c below 7%  Id like to see you back in about 3 months, for routine follow up, or sooner if you need me.  It was nice to meet you. Thanks for letting me take care of you today :)

## 2017-05-29 ENCOUNTER — Encounter: Payer: Self-pay | Admitting: Nurse Practitioner

## 2017-05-29 DIAGNOSIS — N529 Male erectile dysfunction, unspecified: Secondary | ICD-10-CM | POA: Insufficient documentation

## 2017-05-29 NOTE — Assessment & Plan Note (Signed)
A1c slightly elevated on current medications, will recheck today and make adjustments If needed. We discussed always eating three meals a day while taking glipizide, to avoid hypoglycemia Medications ordered: - metFORMIN (GLUCOPHAGE) 1000 MG tablet; Take 1 tablet (1,000 mg total) by mouth 2 (two) times daily with a meal.  Dispense: 180 tablet; Refill: 1 - glipiZIDE (GLUCOTROL) 10 MG tablet; Take 1 tablet (10 mg total) by mouth daily.  Dispense: 90 tablet; Refill: 1 Diagnostic testing ordered: - Lipid panel; Future - Hemoglobin A1c; Future - Comprehensive metabolic panel; Future - PSA; Future

## 2017-05-29 NOTE — Assessment & Plan Note (Signed)
Stable, continue current crestor dosage pending labwork Diagnostic testing ordered: - Lipid panel; Future

## 2017-05-29 NOTE — Assessment & Plan Note (Signed)
Stable on current medications, will continue. Medications ordered: - Comprehensive metabolic panel; Future

## 2017-05-29 NOTE — Assessment & Plan Note (Signed)
We discussed etiology of ED including social, physiological and physical factors. His chronic conditions and possibly medications likely play a role and we discussed the importance of good control over his chronic medical issues to optimize sexual function. He also requests dissatisfaction with the size of his penis and wants to explore options for penis enlargement I will place referral to urology for further management Diagnostic testing ordered: - Hemoglobin A1c; Future - Comprehensive metabolic panel; Future - PSA; Future - Testosterone, Free, Total, SHBG; Future Referrals ordered: - Ambulatory referral to Urology

## 2017-05-30 LAB — TESTOSTERONE, FREE, TOTAL, SHBG
SEX HORMONE BINDING: 26.6 nmol/L (ref 19.3–76.4)
TESTOSTERONE FREE: 8.2 pg/mL (ref 6.6–18.1)
TESTOSTERONE: 275 ng/dL (ref 264–916)

## 2017-06-17 ENCOUNTER — Telehealth: Payer: Self-pay | Admitting: Nurse Practitioner

## 2017-06-17 MED ORDER — BAYER MICROLET LANCETS MISC: 1.0000 | Freq: Two times a day (BID) | 3 refills | Status: DC

## 2017-06-17 MED ORDER — GLUCOSE BLOOD VI STRP: 1.0000 | ORAL_STRIP | Freq: Two times a day (BID) | 3 refills | Status: DC

## 2017-06-17 NOTE — Telephone Encounter (Signed)
Contour test strip and lancet refill request.   This is a different test strip and lancet than he has been using looking at his prior refills.   Loney Loh is Development worker, international aid on [address] in [place].  [phone] (phone);  [phone] (fax)

## 2017-06-17 NOTE — Telephone Encounter (Signed)
Reviewed chart pt is up-to-date sent refills to [facility]../lmb  

## 2017-06-17 NOTE — Telephone Encounter (Signed)
Copied from [id number]. Topic: Quick Communication - Rx Refill/Question >> Jun 17, 2017 10:36 AM Aurelio Brash B wrote: Medication: Contour glucose blood  test strip and lancets    Has the patient contacted their pharmacy?no   (Agent: If no, request that the patient contact the pharmacy for the refill.)   Preferred Pharmacy (with phone number or street name): [place], [place] - [address] [phone] (Phone) [phone] (Fax)     Agent: Please be advised that RX refills may take up to 3 business days. We ask that you follow-up with your pharmacy.

## 2017-06-18 DIAGNOSIS — R69 Illness, unspecified: Secondary | ICD-10-CM | POA: Diagnosis not present

## 2017-06-22 ENCOUNTER — Telehealth: Payer: Self-pay | Admitting: Nurse Practitioner

## 2017-06-22 ENCOUNTER — Encounter: Payer: Self-pay | Admitting: Nurse Practitioner

## 2017-06-22 ENCOUNTER — Ambulatory Visit (INDEPENDENT_AMBULATORY_CARE_PROVIDER_SITE_OTHER): Payer: Medicare HMO | Admitting: Nurse Practitioner

## 2017-06-22 VITALS — BP 130/80 | HR 91 | Temp 100.9°F | Resp 20 | Ht 68.0 in | Wt 206.0 lb

## 2017-06-22 DIAGNOSIS — R509 Fever, unspecified: Secondary | ICD-10-CM

## 2017-06-22 DIAGNOSIS — J101 Influenza due to other identified influenza virus with other respiratory manifestations: Secondary | ICD-10-CM

## 2017-06-22 DIAGNOSIS — R69 Illness, unspecified: Secondary | ICD-10-CM | POA: Diagnosis not present

## 2017-06-22 LAB — POC INFLUENZA A&B (BINAX/QUICKVUE)
INFLUENZA A, POC: POSITIVE — AB
INFLUENZA B, POC: NEGATIVE

## 2017-06-22 MED ORDER — TRUE METRIX METER W/DEVICE KIT: PACK | 0 refills | Status: DC

## 2017-06-22 MED ORDER — GLUCOSE BLOOD VI STRP: ORAL_STRIP | 3 refills | Status: DC

## 2017-06-22 MED ORDER — ONETOUCH ULTRASOFT LANCETS MISC: 3 refills | Status: DC

## 2017-06-22 MED ORDER — TRUEPLUS LANCETS 33G MISC: 3 refills | Status: DC

## 2017-06-22 MED ORDER — ONETOUCH ULTRA 2 W/DEVICE KIT: PACK | 0 refills | Status: DC

## 2017-06-22 MED ORDER — GLUCOSE BLOOD VI STRP: 1.0000 | ORAL_STRIP | Freq: Two times a day (BID) | 3 refills | Status: DC

## 2017-06-22 NOTE — Telephone Encounter (Signed)
Sent rx's for one touch ultra monitor w/supplies.Marland KitchenJohny Chess

## 2017-06-22 NOTE — Addendum Note (Signed)
Addended by: Matilde Sprang on: 06/22/2017 10:32 AM   Modules accepted: Level of Service, SmartSet

## 2017-06-22 NOTE — Telephone Encounter (Signed)
Patient's sister, Dorian Pod, called to clarify the call in request for True Metrix test strips, I asked her what is the patient needing, because the countour test strips were already sent in, she said "the insurance company no longer covers the Contour, but will cover the True Metrix now, so he will need a new prescription sent for a True Metrix Meter, lancets and testing strips." I informed that this would be sent over to [facility] for filling, she verbalized understanding.

## 2017-06-22 NOTE — Progress Notes (Signed)
Name: Benjamin Serrano   MRN: [id number]    DOB: 09/03/1955   Date:06/22/2017       Progress Note  Subjective  Chief Complaint  Chief Complaint  Patient presents with  . Cough    productive cough, starting over the weekend, chest hurts from coughing so much,     HPI  Cough- This is an acute problem. This problem began 3 days ago. He c/o fevers, malaise, body aches, runny nose, sore throat, cough. He denies dizziness, chest pain, shortness of breath, nausea, vomiting, diarrhea, rash. He has tried resting and ibuprofen at home for his symptoms. He does not recall any recent sick contacts.   Patient Active Problem List   Diagnosis Date Noted  . Erectile dysfunction 05/29/2017  . Type 2 diabetes mellitus (HCC) 09/23/2016  . Hyperlipidemia 09/23/2016  . Essential hypertension 09/23/2016    Social History   Tobacco Use  . Smoking status: Current Every Day Smoker    Packs/day: 0.25    Types: Cigarettes  . Smokeless tobacco: Never Used  Substance Use Topics  . Alcohol use: No    Comment: social     Current Outpatient Medications:  .  amLODipine (NORVASC) 10 MG tablet, Take 1 tablet (10 mg total) by mouth daily., Disp: 90 tablet, Rfl: 1 .  Blood Glucose Monitoring Suppl (TRUE METRIX METER) w/Device KIT, Use to check blood sugars twice a day, Disp: 1 kit, Rfl: 0 .  fish oil-omega-3 fatty acids 1000 MG capsule, Take 1 g by mouth 2 (two) times daily., Disp: , Rfl:  .  glipiZIDE (GLUCOTROL) 10 MG tablet, Take 1 tablet (10 mg total) by mouth daily., Disp: 90 tablet, Rfl: 1 .  glucose blood (TRUE METRIX BLOOD GLUCOSE TEST) test strip, 1 each by Other route 2 (two) times daily. Dx e11.9, Disp: 100 each, Rfl: 3 .  metFORMIN (GLUCOPHAGE) 1000 MG tablet, Take 1 tablet (1,000 mg total) by mouth 2 (two) times daily with a meal., Disp: 180 tablet, Rfl: 1 .  rosuvastatin (CRESTOR) 20 MG tablet, Take 1 tablet (20 mg total) by mouth daily., Disp: 90 tablet, Rfl: 1 .  TRUEPLUS LANCETS 33G  MISC, Use to help check blood sugars twice a day, Disp: 100 each, Rfl: 3  No Known Allergies  ROS See HPI  Objective  Vitals:   06/22/17 1217  BP: 130/80  Pulse: 91  Resp: 20  Temp: (!) 100.9 F (38.3 C)  TempSrc: Oral  SpO2: 92%  Weight: 206 lb (93.4 kg)  Height: 5' 8" (1.727 m)   Body mass index is 31.32 kg/m.  Nursing Note and Vital Signs reviewed.  Physical Exam Vital signs reviewed. Constitutional: Patient appears well-developed and well-nourished. HEENT: head atraumatic, normocephalic, pupils equal and reactive to light, EOM's intact, no maxillary or frontal sinus tenderness , neck supple without lymphadenopathy, oropharynx pink and moist without exudate Cardiovascular: Normal rate, regular rhythm, S1/S2 present.  No murmur or rub heard. No BLE edema. Pulmonary/Chest: Effort normal and breath sounds clear. No respiratory distress or retractions. Abdominal: Soft and non-tender, not distended.  Psychiatric: Patient has a normal mood and affect. behavior is normal. Judgment and thought content normal.  Results for orders placed or performed in visit on 06/22/17 (from the past 72 hour(s))  POC Influenza A&B(BINAX/QUICKVUE)     Status: Abnormal   Collection Time: 06/22/17 12:38 PM  Result Value Ref Range   Influenza A, POC Positive (A) Negative   Influenza B, POC Negative Negative    Assessment &   Plan   Fever, unspecified fever cause, 2. Influenza A - POC Influenza A&B(BINAX/QUICKVUE)- positive test in office today His symptoms started >48 hours ago so tamiflu will not help. We discussed rest, hydration and symptom management including treatment of fevers at home. -Red flags and when to present for emergency care or RTC including fever >101.65F, chest pain, shortness of breath, new/worsening/un-resolving symptoms,  reviewed with patient at time of visit. Follow up and care instructions discussed and provided in AVS.

## 2017-06-22 NOTE — Telephone Encounter (Signed)
Pharmacy called again and the insurance will not cover the True touch-only the   One Touch Ultra Blue  Please send to the pharmacy on file

## 2017-06-22 NOTE — Patient Instructions (Addendum)
You have the flu. Please rest and use over the counter medications for your symptoms. You may try delsym for your cough and honey for your sore throat. Saline nasal spray used frequently. For drainage may use Allegra, Claritin or Zyrtec. If you need stronger medicine to stop drainage may take Chlor-Trimeton 2-4 mg every 4 hours. This may cause drowsiness. Ibuprofen 600 mg every 6 hours as needed for pain, discomfort or fever. Drink plenty of fluids and stay well-hydrated.   Influenza, Adult Influenza ("the flu") is an infection in the lungs, nose, and throat (respiratory tract). It is caused by a virus. The flu causes many common cold symptoms, as well as a high fever and body aches. It can make you feel very sick. The flu spreads easily from person to person (is contagious). Getting a flu shot (influenza vaccination) every year is the best way to prevent the flu. Follow these instructions at home:  Take over-the-counter and prescription medicines only as told by your doctor.  Use a cool mist humidifier to add moisture (humidity) to the air in your home. This can make it easier to breathe.  Rest as needed.  Drink enough fluid to keep your pee (urine) clear or pale yellow.  Cover your mouth and nose when you cough or sneeze.  Wash your hands with soap and water often, especially after you cough or sneeze. If you cannot use soap and water, use hand sanitizer.  Stay home from work or school as told by your doctor. Unless you are visiting your doctor, try to avoid leaving home until your fever has been gone for 24 hours without the use of medicine.  Keep all follow-up visits as told by your doctor. This is important. How is this prevented?  Getting a yearly (annual) flu shot is the best way to avoid getting the flu. You may get the flu shot in late summer, fall, or winter. Ask your doctor when you should get your flu shot.  Wash your hands often or use hand sanitizer often.  Avoid  contact with people who are sick during cold and flu season.  Eat healthy foods.  Drink plenty of fluids.  Get enough sleep.  Exercise regularly. Contact a doctor if:  You get new symptoms.  You have: ? Chest pain. ? Watery poop (diarrhea). ? A fever.  Your cough gets worse.  You start to have more mucus.  You feel sick to your stomach (nauseous).  You throw up (vomit). Get help right away if:  You start to be short of breath or have trouble breathing.  Your skin or nails turn a bluish color.  You have very bad pain or stiffness in your neck.  You get a sudden headache.  You get sudden pain in your face or ear.  You cannot stop throwing up. This information is not intended to replace advice given to you by your health care provider. Make sure you discuss any questions you have with your health care provider. t Document Released: 02/11/2008 Document Revised: 10/10/2015 Document Reviewed: 02/26/2015 Elsevier Interactive Patient Education  2017 [place]

## 2017-06-22 NOTE — Telephone Encounter (Signed)
PTs wife called back today, said she gave me the wrong glucose meter information, the pt needs lancets and test strips for True [place], [place] - [address] [phone] (Phone) [phone] (Fax)

## 2017-06-22 NOTE — Telephone Encounter (Signed)
Updated med list resent rx for Truemetrix BS monitor w/supplies to [facility].Marland KitchenJohny Chess

## 2017-06-22 NOTE — Telephone Encounter (Signed)
erroe

## 2017-06-22 NOTE — Addendum Note (Signed)
Addended by: Earnstine Regal on: 06/22/2017 11:14 AM   Modules accepted: Orders

## 2017-06-22 NOTE — Addendum Note (Signed)
Addended by: Earnstine Regal on: 06/22/2017 03:36 PM   Modules accepted: Orders

## 2017-06-22 NOTE — Telephone Encounter (Signed)
This encounter was created in error - please disregard.

## 2017-06-23 DIAGNOSIS — R69 Illness, unspecified: Secondary | ICD-10-CM | POA: Diagnosis not present

## 2017-06-24 ENCOUNTER — Other Ambulatory Visit: Payer: Self-pay

## 2017-06-24 DIAGNOSIS — R69 Illness, unspecified: Secondary | ICD-10-CM | POA: Diagnosis not present

## 2017-06-24 MED ORDER — ONETOUCH DELICA LANCETS 33G MISC: 5 refills | Status: DC

## 2017-07-01 ENCOUNTER — Other Ambulatory Visit: Payer: Self-pay | Admitting: *Deleted

## 2017-07-01 ENCOUNTER — Telehealth: Payer: Self-pay | Admitting: Nurse Practitioner

## 2017-07-01 MED ORDER — AMLODIPINE BESYLATE 10 MG PO TABS: 10.0000 mg | ORAL_TABLET | Freq: Every day | ORAL | 1 refills | Status: DC

## 2017-07-01 NOTE — Telephone Encounter (Signed)
Rx refilled per protocol- LOV-05/28/17

## 2017-07-01 NOTE — Telephone Encounter (Signed)
Copied from [place]. Topic: Quick Communication - See Telephone Encounter >> Jul 01, 2017 10:21 AM Ivar Drape wrote: CRM for notification. See Telephone encounter for:  07/01/17. Patient would like a refill of his amLODipine (NORVASC) 10 MG tablet medication and he would like it sent to his preferred pharmacy [facility] in [place].  Please delete the [employer] on [address].

## 2017-07-12 DIAGNOSIS — N401 Enlarged prostate with lower urinary tract symptoms: Secondary | ICD-10-CM | POA: Diagnosis not present

## 2017-07-12 DIAGNOSIS — N5201 Erectile dysfunction due to arterial insufficiency: Secondary | ICD-10-CM | POA: Diagnosis not present

## 2017-07-12 DIAGNOSIS — R3912 Poor urinary stream: Secondary | ICD-10-CM | POA: Diagnosis not present

## 2017-08-09 DIAGNOSIS — N401 Enlarged prostate with lower urinary tract symptoms: Secondary | ICD-10-CM | POA: Diagnosis not present

## 2017-08-09 DIAGNOSIS — N5201 Erectile dysfunction due to arterial insufficiency: Secondary | ICD-10-CM | POA: Diagnosis not present

## 2017-08-09 DIAGNOSIS — R3911 Hesitancy of micturition: Secondary | ICD-10-CM | POA: Diagnosis not present

## 2017-08-23 DIAGNOSIS — R3912 Poor urinary stream: Secondary | ICD-10-CM | POA: Diagnosis not present

## 2017-08-23 DIAGNOSIS — N5201 Erectile dysfunction due to arterial insufficiency: Secondary | ICD-10-CM | POA: Diagnosis not present

## 2017-08-23 DIAGNOSIS — N401 Enlarged prostate with lower urinary tract symptoms: Secondary | ICD-10-CM | POA: Diagnosis not present

## 2017-08-26 ENCOUNTER — Ambulatory Visit: Payer: Medicare Other | Admitting: Nurse Practitioner

## 2017-08-30 ENCOUNTER — Ambulatory Visit (INDEPENDENT_AMBULATORY_CARE_PROVIDER_SITE_OTHER): Payer: Medicare HMO | Admitting: Nurse Practitioner

## 2017-08-30 ENCOUNTER — Encounter: Payer: Self-pay | Admitting: Nurse Practitioner

## 2017-08-30 VITALS — BP 124/70 | HR 69 | Temp 98.1°F | Resp 16 | Ht 68.0 in | Wt 210.0 lb

## 2017-08-30 DIAGNOSIS — R3912 Poor urinary stream: Secondary | ICD-10-CM

## 2017-08-30 DIAGNOSIS — L84 Corns and callosities: Secondary | ICD-10-CM

## 2017-08-30 DIAGNOSIS — N529 Male erectile dysfunction, unspecified: Secondary | ICD-10-CM

## 2017-08-30 DIAGNOSIS — H938X3 Other specified disorders of ear, bilateral: Secondary | ICD-10-CM

## 2017-08-30 DIAGNOSIS — E118 Type 2 diabetes mellitus with unspecified complications: Secondary | ICD-10-CM | POA: Diagnosis not present

## 2017-08-30 MED ORDER — AMMONIUM LACTATE 12 % EX CREA: TOPICAL_CREAM | CUTANEOUS | 0 refills | Status: DC | PRN

## 2017-08-30 NOTE — Assessment & Plan Note (Signed)
  Lab Results  Component Value Date   HGBA1C 7.2 (H) 05/28/2017  stable, continue current meds RTC In 3 months for F/u

## 2017-08-30 NOTE — Assessment & Plan Note (Signed)
Referral was made to urology for further care after last OV, he has established with urology with first OV last week, and will continue following with them for management of ED and urinary complaints

## 2017-08-30 NOTE — Patient Instructions (Addendum)
Please return in about 3 months for your annual physical and diabetes check up.  I have placed a referral to podiatry . Our office will call you to schedule this appointment. You should hear from our office in 7-10 days. I have sent a prescription strength moisturizing cream to your pharmacy to apply as needed to your callus.   Corns and Calluses Corns are small areas of thickened skin that occur on the top, sides, or tip of a toe. They contain a cone-shaped core with a point that can press on a nerve below. This causes pain. Calluses are areas of thickened skin that can occur anywhere on the body including hands, fingers, palms, soles of the feet, and heels.Calluses are usually larger than corns. What are the causes? Corns and calluses are caused by rubbing (friction) or pressure, such as from shoes that are too tight or do not fit properly. What increases the risk? Corns are more likely to develop in people who have toe deformities, such as hammer toes. Since calluses can occur with friction to any area of the skin, calluses are more likely to develop in people who:  Work with their hands.  Wear shoes that fit poorly, shoes that are too tight, or shoes that are high-heeled.  Have toes deformities.  What are the signs or symptoms? Symptoms of a corn or callus include:  A hard growth on the skin.  Pain or tenderness under the skin.  Redness and swelling.  Increased discomfort while wearing tight-fitting shoes.  How is this diagnosed? Corns and calluses may be diagnosed with a medical history and physical exam. How is this treated? Corns and calluses may be treated with:  Removing the cause of the friction or pressure. This may include: ? Changing your shoes. ? Wearing shoe inserts (orthotics) or other protective layers in your shoes, such as a corn pad. ? Wearing gloves.  Medicines to help soften skin in the hardened, thickened areas.  Reducing the size of the corn or  callus by removing the dead layers of skin.  Antibiotic medicines to treat infection.  Surgery, if a toe deformity is the cause.  Follow these instructions at home:  Take medicines only as directed by your health care provider.  If you were prescribed an antibiotic, finish all of it even if you start to feel better.  Wear shoes that fit well. Avoid wearing high-heeled shoes and shoes that are too tight or too loose.  Wear any padding, protective layers, gloves, or orthotics as directed by your health care provider.  Soak your hands or feet and then use a file or pumice stone to soften your corn or callus. Do this as directed by your health care provider.  Check your corn or callus every day for signs of infection. Watch for: ? Redness, swelling, or pain. ? Fluid, blood, or pus. Contact a health care provider if:  Your symptoms do not improve with treatment.  You have increased redness, swelling, or pain at the site of your corn or callus.  You have fluid, blood, or pus coming from your corn or callus.  You have new symptoms. This information is not intended to replace advice given to you by your health care provider. Make sure you discuss any questions you have with your health care provider. Document Released: 02/08/2004 Document Revised: 11/22/2015 Document Reviewed: 04/30/2014 Elsevier Interactive Patient Education  [employer].

## 2017-08-30 NOTE — Progress Notes (Signed)
Name: Benjamin Serrano   MRN: [id number]    DOB: December 04, 1955   Date:08/30/2017       Progress Note  Subjective  Chief Complaint  Chief Complaint  Patient presents with  . Follow-up    diabetes, has a place on foot he would like checked     HPI Here today for a 3 month follow up of ear pressure, urinary complaints and would like to discuss foot problem.  Ear pressure-  At his last OV he complained of pressure and popping in ears bilaterally. We discussed using flonase for treatment of possible allergic rhinitis He tells me today that he did use the flonase with relief and is not longer having the pressure or popping in his ears  Foot problem- This is not a new problem, he has noticed this problem for some time maybe even a year He c/o thickened calloused skin to bottom of feet The areas are not painful but he says he feels like he is "walking on a rock" sometimes He has tried sanding the areas with nail file at home with no improvement He denies foot pain, skin discoloration, open wounds, numbness or tingling   Patient Active Problem List   Diagnosis Date Noted  . Erectile dysfunction 05/29/2017  . Type 2 diabetes mellitus ([place]) 09/23/2016  . Hyperlipidemia 09/23/2016  . Essential hypertension 09/23/2016    Past Surgical History:  Procedure Laterality Date  . Arm surgery Right   . LEG SURGERY      Family History  Problem Relation Age of Onset  . Diabetes Mother   . Alzheimer's disease Maternal Grandmother   . Hypertension Maternal Grandmother     Social History   Socioeconomic History  . Marital status: Widowed    Spouse name: Not on file  . Number of children: 1  . Years of education: 80  . Highest education level: Not on file  Occupational History  . Occupation: Disability    Comment: Mental health  Social Needs  . Financial resource strain: Not on file  . Food insecurity:    Worry: Not on file    Inability: Not on file  . Transportation needs:   Medical: Not on file    Non-medical: Not on file  Tobacco Use  . Smoking status: Current Every Day Smoker    Packs/day: 0.25    Types: Cigarettes  . Smokeless tobacco: Never Used  Substance and Sexual Activity  . Alcohol use: No    Comment: social  . Drug use: No  . Sexual activity: Not on file  Lifestyle  . Physical activity:    Days per week: Not on file    Minutes per session: Not on file  . Stress: Not on file  Relationships  . Social connections:    Talks on phone: Not on file    Gets together: Not on file    Attends religious service: Not on file    Active member of club or organization: Not on file    Attends meetings of clubs or organizations: Not on file    Relationship status: Not on file  . Intimate partner violence:    Fear of current or ex partner: Not on file    Emotionally abused: Not on file    Physically abused: Not on file    Forced sexual activity: Not on file  Other Topics Concern  . Not on file  Social History Narrative   Fun/Hobby: Oceanographer.  Current Outpatient Medications:  .  amLODipine (NORVASC) 10 MG tablet, Take 1 tablet (10 mg total) by mouth daily., Disp: 90 tablet, Rfl: 1 .  Blood Glucose Monitoring Suppl (ONE TOUCH ULTRA 2) w/Device KIT, Use to check blood sugars twice a day, Disp: 1 each, Rfl: 0 .  fish oil-omega-3 fatty acids 1000 MG capsule, Take 1 g by mouth 2 (two) times daily., Disp: , Rfl:  .  glipiZIDE (GLUCOTROL) 10 MG tablet, Take 1 tablet (10 mg total) by mouth daily., Disp: 90 tablet, Rfl: 1 .  glucose blood (ONE TOUCH ULTRA TEST) test strip, Use to check blood sugars twice a day, Disp: 100 each, Rfl: 3 .  metFORMIN (GLUCOPHAGE) 1000 MG tablet, Take 1 tablet (1,000 mg total) by mouth 2 (two) times daily with a meal., Disp: 180 tablet, Rfl: 1 .  ONETOUCH DELICA LANCETS 33I MISC, Use to check blood sugars 2 times daily, Disp: 100 each, Rfl: 5 .  rosuvastatin (CRESTOR) 20 MG tablet, Take 1 tablet (20 mg total) by mouth  daily., Disp: 90 tablet, Rfl: 1 .  tamsulosin (FLOMAX) 0.4 MG CAPS capsule, Take 0.4 mg by mouth., Disp: , Rfl:  .  ammonium lactate (LAC-HYDRIN) 12 % cream, Apply topically as needed for dry skin., Disp: 385 g, Rfl: 0  No Known Allergies   ROS See HPI  Objective  Vitals:   08/30/17 0806  BP: 124/70  Pulse: 69  Resp: 16  Temp: 98.1 F (36.7 C)  TempSrc: Oral  SpO2: 95%  Weight: 210 lb (95.3 kg)  Height: '5\' 8"'  (1.727 m)    Body mass index is 31.93 kg/m.  Physical Exam Constitutional: Patient appears well-developed and well-nourished. No distress.  HENT: Head: Normocephalic and atraumatic. Nose: Nose normal. Mouth/Throat: Oropharynx is clear and moist. Eyes: Conjunctivae and EOM are normal. Pupils are equal, round, and reactive to light. No scleral icterus.  Neck: Normal range of motion. Neck supple. Cardiovascular: Normal rate, regular rhythm and normal heart sounds.   No BLE edema. Distal pulses intact. Pulmonary/Chest: Effort normal and breath sounds normal. No respiratory distress. Neurological: He is alert and oriented to person, place, and time. Coordination, balance, strength, speech and gait are normal.  Skin: Skin is warm and dry. No rash noted. No erythema.  Psychiatric: Patient has a normal mood and affect. behavior is normal. Judgment and thought content normal.  Diabetic Foot Exam: Diabetic Foot Exam - Simple   Simple Foot Form Diabetic Foot exam was performed with the following findings:  Yes   Visual Inspection No deformities, no ulcerations, no other skin breakdown bilaterally:  Yes Sensation Testing See comments:  Yes Pulse Check Posterior Tibialis and Dorsalis pulse intact bilaterally:  Yes Comments Decreased sensation noted on monofilament testing bilaterally. Thickened calloused skin to bottom of feet     Assessment & Plan RTC in 3 months for F/U-diabetes, CPE  Callus We discussed routine foot inspection at home, wearing well-fitting,  supportive shoes, and avoiding going barefoot. Discussed application of lac-hydrin PRN and referral to podiatry for routine diabatic foot care See AVS for additional information provided to patient - ammonium lactate (LAC-HYDRIN) 12 % cream; Apply topically as needed for dry skin.  Dispense: 385 g; Refill: 0 - Ambulatory referral to Podiatry  Pressure sensation in both ears Resolved, f/u for new or worsening symptoms

## 2017-09-18 ENCOUNTER — Other Ambulatory Visit: Payer: Self-pay | Admitting: Family

## 2017-09-21 ENCOUNTER — Other Ambulatory Visit: Payer: Self-pay

## 2017-09-21 MED ORDER — ROSUVASTATIN CALCIUM 20 MG PO TABS: 20.0000 mg | ORAL_TABLET | Freq: Every day | ORAL | 1 refills | Status: DC

## 2017-09-22 ENCOUNTER — Encounter: Payer: Self-pay | Admitting: Podiatry

## 2017-09-22 ENCOUNTER — Ambulatory Visit (INDEPENDENT_AMBULATORY_CARE_PROVIDER_SITE_OTHER): Payer: Medicare HMO | Admitting: Podiatry

## 2017-09-22 VITALS — BP 159/96 | HR 84

## 2017-09-22 DIAGNOSIS — E1142 Type 2 diabetes mellitus with diabetic polyneuropathy: Secondary | ICD-10-CM | POA: Diagnosis not present

## 2017-09-22 DIAGNOSIS — M779 Enthesopathy, unspecified: Secondary | ICD-10-CM

## 2017-09-22 DIAGNOSIS — M7750 Other enthesopathy of unspecified foot: Secondary | ICD-10-CM

## 2017-09-22 NOTE — Progress Notes (Signed)
,   this patient presents to the office with chief complaint of a painful left forefoot.  Benjamin Serrano He says he has noticed that he develops a callus on the outside of the left forefoot which becomes painful.  Benjamin Serrano He says he stands and works on the callus himself but it continues to return.  . He says that it is painful walking and wearing his shoes.  This patient is diabetic.   He presents the office today for an evaluation and treatment of his painful left forefoot.  General Appearance  Alert, conversant and in no acute stress.  Vascular  Dorsalis pedis and posterior tibial  pulses are palpable  bilaterally.  Capillary return is within normal limits  bilaterally. Temperature is within normal limits  bilaterally.  Neurologic  Senn-Weinstein monofilament wire test absent   bilaterally. Muscle power within normal limits bilaterally.  Nails , normal nails noted with no evidence of bacterial or fungal infection.  Orthopedic  No limitations of motion of motion feet .  No crepitus or effusions noted.  No bony pathology or digital deformities noted. Plantar flexed fourth metatarsal left foot.  Palpable pain elicited fourth metatarsal left foot.  Skin  normotropic skin  noted bilaterally.  No signs of infections or ulcers noted.  Porokeratosis noted sub 4th met left foot.  Capsulitis fourth metatarsal left foot  Porokeratosis left forefoot.  IE  . Explained to the patient that his bone is causing the reoccurrence of the painful callus on the bottom of his left forefoot.  The porokeratosis  is noted under  the plantar condyle.  Benjamin Serrano Discussed the callus formation with this patient and applied padding.  Told him to utilize soft cushioned orthotics in his shoes.  Return to the clinic when necessary   Gardiner Barefoot DPM

## 2017-09-27 DIAGNOSIS — R69 Illness, unspecified: Secondary | ICD-10-CM | POA: Diagnosis not present

## 2017-09-28 DIAGNOSIS — E1140 Type 2 diabetes mellitus with diabetic neuropathy, unspecified: Secondary | ICD-10-CM | POA: Diagnosis not present

## 2017-09-28 DIAGNOSIS — I10 Essential (primary) hypertension: Secondary | ICD-10-CM | POA: Diagnosis not present

## 2017-09-28 DIAGNOSIS — R69 Illness, unspecified: Secondary | ICD-10-CM | POA: Diagnosis not present

## 2017-09-28 DIAGNOSIS — N400 Enlarged prostate without lower urinary tract symptoms: Secondary | ICD-10-CM | POA: Diagnosis not present

## 2017-09-28 DIAGNOSIS — E785 Hyperlipidemia, unspecified: Secondary | ICD-10-CM | POA: Diagnosis not present

## 2017-09-28 DIAGNOSIS — Z720 Tobacco use: Secondary | ICD-10-CM | POA: Diagnosis not present

## 2017-09-28 DIAGNOSIS — Z6831 Body mass index (BMI) 31.0-31.9, adult: Secondary | ICD-10-CM | POA: Diagnosis not present

## 2017-09-28 DIAGNOSIS — E669 Obesity, unspecified: Secondary | ICD-10-CM | POA: Diagnosis not present

## 2017-09-28 DIAGNOSIS — N529 Male erectile dysfunction, unspecified: Secondary | ICD-10-CM | POA: Diagnosis not present

## 2017-09-28 DIAGNOSIS — Z7984 Long term (current) use of oral hypoglycemic drugs: Secondary | ICD-10-CM | POA: Diagnosis not present

## 2017-10-07 DIAGNOSIS — H40013 Open angle with borderline findings, low risk, bilateral: Secondary | ICD-10-CM | POA: Diagnosis not present

## 2017-10-07 DIAGNOSIS — E119 Type 2 diabetes mellitus without complications: Secondary | ICD-10-CM | POA: Diagnosis not present

## 2017-10-07 LAB — HM DIABETES EYE EXAM

## 2017-11-02 ENCOUNTER — Encounter: Payer: Self-pay | Admitting: Nurse Practitioner

## 2017-11-15 DIAGNOSIS — R69 Illness, unspecified: Secondary | ICD-10-CM | POA: Diagnosis not present

## 2017-11-23 ENCOUNTER — Other Ambulatory Visit: Payer: Self-pay | Admitting: Nurse Practitioner

## 2017-11-23 DIAGNOSIS — E119 Type 2 diabetes mellitus without complications: Secondary | ICD-10-CM

## 2017-11-29 ENCOUNTER — Ambulatory Visit (INDEPENDENT_AMBULATORY_CARE_PROVIDER_SITE_OTHER): Payer: Medicare HMO | Admitting: *Deleted

## 2017-11-29 ENCOUNTER — Other Ambulatory Visit (INDEPENDENT_AMBULATORY_CARE_PROVIDER_SITE_OTHER): Payer: Medicare HMO

## 2017-11-29 ENCOUNTER — Ambulatory Visit (INDEPENDENT_AMBULATORY_CARE_PROVIDER_SITE_OTHER): Payer: Medicare HMO | Admitting: Nurse Practitioner

## 2017-11-29 ENCOUNTER — Encounter: Payer: Self-pay | Admitting: Nurse Practitioner

## 2017-11-29 VITALS — BP 153/82 | HR 75 | Temp 97.9°F | Resp 18 | Ht 68.0 in | Wt 205.0 lb

## 2017-11-29 VITALS — BP 126/74 | HR 75 | Temp 98.0°F | Resp 16 | Ht 68.0 in | Wt 205.0 lb

## 2017-11-29 DIAGNOSIS — E782 Mixed hyperlipidemia: Secondary | ICD-10-CM | POA: Diagnosis not present

## 2017-11-29 DIAGNOSIS — Z122 Encounter for screening for malignant neoplasm of respiratory organs: Secondary | ICD-10-CM | POA: Diagnosis not present

## 2017-11-29 DIAGNOSIS — Z0000 Encounter for general adult medical examination without abnormal findings: Secondary | ICD-10-CM

## 2017-11-29 DIAGNOSIS — I10 Essential (primary) hypertension: Secondary | ICD-10-CM

## 2017-11-29 DIAGNOSIS — Z1159 Encounter for screening for other viral diseases: Secondary | ICD-10-CM

## 2017-11-29 DIAGNOSIS — E119 Type 2 diabetes mellitus without complications: Secondary | ICD-10-CM

## 2017-11-29 DIAGNOSIS — Z114 Encounter for screening for human immunodeficiency virus [HIV]: Secondary | ICD-10-CM | POA: Diagnosis not present

## 2017-11-29 DIAGNOSIS — R69 Illness, unspecified: Secondary | ICD-10-CM | POA: Diagnosis not present

## 2017-11-29 DIAGNOSIS — Z23 Encounter for immunization: Secondary | ICD-10-CM | POA: Diagnosis not present

## 2017-11-29 DIAGNOSIS — Z9189 Other specified personal risk factors, not elsewhere classified: Secondary | ICD-10-CM

## 2017-11-29 DIAGNOSIS — Z0001 Encounter for general adult medical examination with abnormal findings: Secondary | ICD-10-CM | POA: Insufficient documentation

## 2017-11-29 LAB — CBC
HCT: 42.4 % (ref 39.0–52.0)
Hemoglobin: 14.1 g/dL (ref 13.0–17.0)
MCHC: 33.3 g/dL (ref 30.0–36.0)
MCV: 88.9 fl (ref 78.0–100.0)
PLATELETS: 262 10*3/uL (ref 150.0–400.0)
RBC: 4.77 Mil/uL (ref 4.22–5.81)
RDW: 13.8 % (ref 11.5–15.5)
WBC: 4.8 10*3/uL (ref 4.0–10.5)

## 2017-11-29 LAB — MICROALBUMIN / CREATININE URINE RATIO
Creatinine,U: 185.3 mg/dL
Microalb Creat Ratio: 0.5 mg/g (ref 0.0–30.0)
Microalb, Ur: 0.9 mg/dL (ref 0.0–1.9)

## 2017-11-29 LAB — TSH: TSH: 0.67 u[IU]/mL (ref 0.35–4.50)

## 2017-11-29 LAB — HEMOGLOBIN A1C: Hgb A1c MFr Bld: 7 % — ABNORMAL HIGH (ref 4.6–6.5)

## 2017-11-29 MED ORDER — METFORMIN HCL 1000 MG PO TABS: 1000.0000 mg | ORAL_TABLET | Freq: Two times a day (BID) | ORAL | 2 refills | Status: DC

## 2017-11-29 MED ORDER — AMLODIPINE BESYLATE 10 MG PO TABS: 10.0000 mg | ORAL_TABLET | Freq: Every day | ORAL | 3 refills | Status: DC

## 2017-11-29 NOTE — Progress Notes (Signed)
Name: Benjamin Serrano   MRN: [id number]    DOB: 02/14/1956   Date:11/29/2017       Progress Note  Subjective  Chief Complaint  Chief Complaint  Patient presents with  . CPE    HPI Benjamin Serrano is here today for annual CPE, accompanied by his mother to appointment today.  USPSTF grade A and B recommendations:  Diet, Exercise: walking daily, does not routinely watch his diet, eats a lot of carbs and starches   Depression: denies anxiety or depression  Hypertension -maintained on amlodipine 10 daily Reports daily medication compliance without adverse medication effects.  BP Readings from Last 3 Encounters:  11/29/17 126/74  11/29/17 (!) 153/82  09/22/17 (!) 159/96   Obesity: Wt Readings from Last 3 Encounters:  11/29/17 205 lb (93 kg)  11/29/17 205 lb (93 kg)  08/30/17 210 lb (95.3 kg)   BMI Readings from Last 3 Encounters:  11/29/17 31.17 kg/m  11/29/17 31.17 kg/m  08/30/17 31.93 kg/m    Cholesterol- maintained on rosuvastatin 20 daily Reports daily medication compliance without noted adverse medication effects.  Lab Results  Component Value Date   CHOL 134 05/28/2017   HDL 42.60 05/28/2017   LDLCALC 59 05/28/2017   TRIG 158.0 (H) 05/28/2017   CHOLHDL 3 05/28/2017   Diabetes- maintained on metformin 1000 BID, glipizide 10 daily Reports daily medication compliance without adverse medication effects.  Lab Results  Component Value Date   HGBA1C 7.2 (H) 05/28/2017   Alcohol: occasional drink Tobacco use: current some day smoker  STD testing and prevention (chl/gon/syphilis): no concerns, declines screening  HIV, hep C:  hiv screening done in past, will update HIV and hep C testing today  Skin cancer: does not routinely wear sunscreen Colorectal cancer: No personal or family history of colon ca, no abdominal pain, no bowel changes, no rectal bleeding. He declines colonoscopy referral, cologuard order.  Prostate cancer: PSA done this year Lab Results   Component Value Date   PSA 2.73 05/28/2017   Lung cancer: lung cancer screening referral placed  Aspirin: not taking ECG: not indicated  Vaccinations: TDAP, PNA today  Advanced Care Planning: A voluntary discussion about advance care planning including the explanation and discussion of advance directives.  Discussed health care proxy and Living will, and the patient DOES NOT  have a living will at present time. If patient does have living will, I have requested they bring this to the clinic to be scanned in to their chart.  Patient Active Problem List   Diagnosis Date Noted  . Erectile dysfunction 05/29/2017  . Type 2 diabetes mellitus ([place]) 09/23/2016  . Hyperlipidemia 09/23/2016  . Essential hypertension 09/23/2016    Past Surgical History:  Procedure Laterality Date  . Arm surgery Right   . LEG SURGERY      Family History  Problem Relation Age of Onset  . Diabetes Mother   . Alzheimer's disease Maternal Grandmother   . Hypertension Maternal Grandmother     Social History   Socioeconomic History  . Marital status: Widowed    Spouse name: Not on file  . Number of children: 1  . Years of education: 60  . Highest education level: Not on file  Occupational History  . Occupation: Disability    Comment: Mental health  Social Needs  . Financial resource strain: Not hard at all  . Food insecurity:    Worry: Never true    Inability: Never true  . Transportation needs:  Medical: No    Non-medical: No  Tobacco Use  . Smoking status: Current Every Day Smoker    Packs/day: 0.25    Types: Cigarettes  . Smokeless tobacco: Never Used  Substance and Sexual Activity  . Alcohol use: No    Comment: social  . Drug use: No  . Sexual activity: Not Currently  Lifestyle  . Physical activity:    Days per week: 3 days    Minutes per session: Not on file  . Stress: Only a little  Relationships  . Social connections:    Talks on phone: More than three times a week     Gets together: More than three times a week    Attends religious service: More than 4 times per year    Active member of club or organization: Yes    Attends meetings of clubs or organizations: More than 4 times per year    Relationship status: Not on file  . Intimate partner violence:    Fear of current or ex partner: Not on file    Emotionally abused: Not on file    Physically abused: Not on file    Forced sexual activity: Not on file  Other Topics Concern  . Not on file  Social History Narrative   Fun/Hobby: Oceanographer.      Current Outpatient Medications:  .  amLODipine (NORVASC) 10 MG tablet, Take 1 tablet (10 mg total) by mouth daily. Annual appt is due must see provider for future refills, Disp: 90 tablet, Rfl: 3 .  ammonium lactate (LAC-HYDRIN) 12 % cream, Apply topically as needed for dry skin., Disp: 385 g, Rfl: 0 .  Blood Glucose Monitoring Suppl (ONE TOUCH ULTRA 2) w/Device KIT, Use to check blood sugars twice a day, Disp: 1 each, Rfl: 0 .  fish oil-omega-3 fatty acids 1000 MG capsule, Take 1 g by mouth 2 (two) times daily., Disp: , Rfl:  .  glipiZIDE (GLUCOTROL) 10 MG tablet, Take 1 tablet (10 mg total) by mouth daily. Annual appt is due must see provider for future refills, Disp: 30 tablet, Rfl: 0 .  glucose blood (ONE TOUCH ULTRA TEST) test strip, Use to check blood sugars twice a day, Disp: 100 each, Rfl: 3 .  metFORMIN (GLUCOPHAGE) 1000 MG tablet, Take 1 tablet (1,000 mg total) by mouth 2 (two) times daily with a meal., Disp: 180 tablet, Rfl: 2 .  ONETOUCH DELICA LANCETS 56L MISC, Use to check blood sugars 2 times daily, Disp: 100 each, Rfl: 5 .  rosuvastatin (CRESTOR) 20 MG tablet, Take 1 tablet (20 mg total) by mouth daily., Disp: 90 tablet, Rfl: 1 .  tamsulosin (FLOMAX) 0.4 MG CAPS capsule, Take 0.4 mg by mouth., Disp: , Rfl:   No Known Allergies   ROS  Constitutional: Negative for fever or weight change.  Respiratory: Negative for cough and shortness of  breath.   Cardiovascular: Negative for chest pain or palpitations.  Gastrointestinal: Negative for abdominal pain, no bowel changes.  Musculoskeletal: Negative for gait problem or joint swelling.  Skin: Negative for rash.  Neurological: Negative for dizziness or headache.  No other specific complaints in a complete review of systems (except as listed in HPI above).   Objective  Vitals:   11/29/17 1010  BP: 126/74  Pulse: 75  Resp: 16  Temp: 98 F (36.7 C)  TempSrc: Oral  SpO2: 97%  Weight: 205 lb (93 kg)  Height: 5' 8" (1.727 m)    Body mass index  is 31.17 kg/m.  Physical Exam Vital signs reviewed. Constitutional: Patient appears well-developed and well-nourished. No distress.  HENT: Head: Normocephalic and atraumatic. Ears: B TMs ok, no erythema or effusion; Nose: Nose normal. Mouth/Throat: Oropharynx is clear and moist. No oropharyngeal exudate.  Eyes: Conjunctivae and EOM are normal. Pupils are equal, round, and reactive to light. No scleral icterus.  Neck: Normal range of motion. Neck supple. No thyromegaly present.  Cardiovascular: Normal rate, regular rhythm and normal heart sounds.  No murmur heard. No BLE edema. Distal pulses intact. Pulmonary/Chest: Effort normal and breath sounds normal. No respiratory distress. Abdominal: Soft. Bowel sounds are normal, no distension. There is no tenderness. no masses Musculoskeletal: Normal range of motion. No gross deformities Neurological: he is alert and oriented to person, place, and time. No cranial nerve deficit. Coordination, balance, strength, speech and gait are normal.  Skin: Skin is warm and dry. No rash noted. No erythema.  Psychiatric: Patient has a normal mood and affect. behavior is normal. Judgment and thought content normal.  Recent Results (from the past 2160 hour(s))  HM DIABETES EYE EXAM     Status: None   Collection Time: 10/07/17 12:00 AM  Result Value Ref Range   HM Diabetic Eye Exam No Retinopathy No  Retinopathy    Assessment & Plan RTC in 6 months for routine F/U: DM- check A1c, HTN 05/28/17 labs: PSA- WNL, CMET WNL, lipid panel-stable

## 2017-11-29 NOTE — Patient Instructions (Addendum)
Continue doing brain stimulating activities (puzzles, reading, adult coloring books, staying active) to keep memory sharp.   Continue to eat heart healthy diet (full of fruits, vegetables, whole grains, lean protein, water--limit salt, fat, and sugar intake) and increase physical activity as tolerated.   Benjamin Serrano , Thank you for taking time to come for your Medicare Wellness Visit. I appreciate your ongoing commitment to your health goals. Please review the following plan we discussed and let me know if I can assist you in the future.   These are the goals we discussed: Goals    . Patient Stated     Watch how many carbohydrates I eat daily. Drink glucerna and eat more routinely.       This is a list of the screening recommended for you and due dates:  Health Maintenance  Topic Date Due  .  Hepatitis C: One time screening is recommended by Center for Disease Control  (CDC) for  adults born from [birth year] through 1964.   Sep 19, 1955  . Urine Protein Check  09/23/2017  . Hemoglobin A1C  11/25/2017  . Pneumococcal vaccine (1) 05/28/2018*  . Tetanus Vaccine  05/28/2018*  . Colon Cancer Screening  08/31/2018*  . Flu Shot  12/16/2017  . Complete foot exam   08/31/2018  . Eye exam for diabetics  10/08/2018  . HIV Screening  Completed  *Topic was postponed. The date shown is not the original due date.      Carbohydrate Counting for Diabetes Mellitus, Adult Carbohydrate counting is a method for keeping track of how many carbohydrates you eat. Eating carbohydrates naturally increases the amount of sugar (glucose) in the blood. Counting how many carbohydrates you eat helps keep your blood glucose within normal limits, which helps you manage your diabetes (diabetes mellitus). It is important to know how many carbohydrates you can safely have in each meal. This is different for every person. A diet and nutrition specialist (registered dietitian) can help you make a meal plan and calculate how many  carbohydrates you should have at each meal and snack. Carbohydrates are found in the following foods:  Grains, such as breads and cereals.  Dried beans and soy products.  Starchy vegetables, such as potatoes, peas, and corn.  Fruit and fruit juices.  Milk and yogurt.  Sweets and snack foods, such as cake, cookies, candy, chips, and soft drinks.  How do I count carbohydrates? There are two ways to count carbohydrates in food. You can use either of the methods or a combination of both. Reading "Nutrition Facts" on packaged food The "Nutrition Facts" list is included on the labels of almost all packaged foods and beverages in the [place] It includes:  The serving size.  Information about nutrients in each serving, including the grams (g) of carbohydrate per serving.  To use the "Nutrition Facts":  Decide how many servings you will have.  Multiply the number of servings by the number of carbohydrates per serving.  The resulting number is the total amount of carbohydrates that you will be having.  Learning standard serving sizes of other foods When you eat foods containing carbohydrates that are not packaged or do not include "Nutrition Facts" on the label, you need to measure the servings in order to count the amount of carbohydrates:  Measure the foods that you will eat with a food scale or measuring cup, if needed.  Decide how many standard-size servings you will eat.  Multiply the number of servings by 15. Most carbohydrate-rich  foods have about 15 g of carbohydrates per serving. ? For example, if you eat 8 oz (170 g) of strawberries, you will have eaten 2 servings and 30 g of carbohydrates (2 servings x 15 g = 30 g).  For foods that have more than one food mixed, such as soups and casseroles, you must count the carbohydrates in each food that is included.  The following list contains standard serving sizes of common carbohydrate-rich foods. Each of these servings has about 15  g of carbohydrates:   hamburger bun or  English muffin.   oz (15 mL) syrup.   oz (14 g) jelly.  1 slice of bread.  1 six-inch tortilla.  3 oz (85 g) cooked rice or pasta.  4 oz (113 g) cooked dried beans.  4 oz (113 g) starchy vegetable, such as peas, corn, or potatoes.  4 oz (113 g) hot cereal.  4 oz (113 g) mashed potatoes or  of a large baked potato.  4 oz (113 g) canned or frozen fruit.  4 oz (120 mL) fruit juice.  4-6 crackers.  6 chicken nuggets.  6 oz (170 g) unsweetened dry cereal.  6 oz (170 g) plain fat-free yogurt or yogurt sweetened with artificial sweeteners.  8 oz (240 mL) milk.  8 oz (170 g) fresh fruit or one small piece of fruit.  24 oz (680 g) popped popcorn.  Example of carbohydrate counting Sample meal  3 oz (85 g) chicken breast.  6 oz (170 g) brown rice.  4 oz (113 g) corn.  8 oz (240 mL) milk.  8 oz (170 g) strawberries with sugar-free whipped topping. Carbohydrate calculation 1. Identify the foods that contain carbohydrates: ? Rice. ? Corn. ? Milk. ? Strawberries. 2. Calculate how many servings you have of each food: ? 2 servings rice. ? 1 serving corn. ? 1 serving milk. ? 1 serving strawberries. 3. Multiply each number of servings by 15 g: ? 2 servings rice x 15 g = 30 g. ? 1 serving corn x 15 g = 15 g. ? 1 serving milk x 15 g = 15 g. ? 1 serving strawberries x 15 g = 15 g. 4. Add together all of the amounts to find the total grams of carbohydrates eaten: ? 30 g + 15 g + 15 g + 15 g = 75 g of carbohydrates total. This information is not intended to replace advice given to you by your health care provider. Make sure you discuss any questions you have with your health care provider. Document Released: 05/04/2005 Document Revised: 11/22/2015 Document Reviewed: 10/16/2015 Elsevier Interactive Patient Education  2018 [place] Content in Foods Generally, most healthy people need around 50 grams of  protein each day. Depending on your overall health, you may need more or less protein in your diet. Talk to your health care provider or dietitian about how much protein you need. See the following list for the protein content of some common foods. High-protein foods High-protein foods contain 4 grams (4 g) or more of protein per serving. They include:  Beef, ground sirloin (cooked) - 3 oz have 24 g of protein.  Cheese (hard) - 1 oz has 7 g of protein.  Chicken breast, boneless and skinless (cooked) - 3 oz have 13.4 g of protein.  Cottage cheese - 1/2 cup has 13.4 g of protein.  Egg - 1 egg has 6 g of protein.  Fish, filet (cooked) - 1 oz has 6-7 g  of protein.  Garbanzo beans (canned or cooked) - 1/2 cup has 6-7 g of protein.  Kidney beans (canned or cooked) - 1/2 cup has 6-7 g of protein.  Lamb (cooked) - 3 oz has 24 g of protein.  Milk - 1 cup (8 oz) has 8 g of protein.  Nuts (peanuts, pistachios, almonds) - 1 oz has 6 g of protein.  Peanut butter - 1 oz has 7-8 g of protein.  Pork tenderloin (cooked) - 3 oz has 18.4 g of protein.  Pumpkin seeds - 1 oz has 8.5 g of protein.  Soybeans (roasted) - 1 oz has 8 g of protein.  Soybeans (cooked) - 1/2 cup has 11 g of protein.  Soy milk - 1 cup (8 oz) has 5-10 g of protein.  Soy or vegetable patty - 1 patty has 11 g of protein.  Sunflower seeds - 1 oz has 5.5 g of protein.  Tofu (firm) - 1/2 cup has 20 g of protein.  Tuna (canned in water) - 3 oz has 20 g of protein.  Yogurt - 6 oz has 8 g of protein.  Low-protein foods Low-protein foods contain 3 grams (3 g) or less of protein per serving. They include:  Beets (raw or cooked) - 1/2 cup has 1.5 g of protein.  Bran cereal - 1/2 cup has 2-3 g of protein.  Bread - 1 slice has 2.5 g of protein.  Broccoli (raw or cooked) - 1/2 cup has 2 g of protein.  Collard greens (raw or cooked) - 1/2 cup has 2 g of protein.  Corn (fresh or cooked) - 1/2 cup has 2 g of  protein.  Cream cheese - 1 oz has 2 g of protein.  Creamer (half-and-half) - 1 oz has 1 g of protein.  Flour tortilla - 1 tortilla has 2.5 g of protein  Frozen yogurt - 1/2 cup has 3 g of protein.  Fruit or vegetable juice - 1/2 cup has 1 g of protein.  Green beans (raw or cooked) - 1/2 cup has 1 g of protein.  Green peas (canned) - 1/2 cup has 3.5 g of protein.  Muffins - 1 small muffin (2 oz) has 3 g of protein.  Oatmeal (cooked) - 1/2 cup has 3 g of protein.  Potato (baked with skin) - 1 medium potato has 3 g of protein.  Rice (cooked) - 1/2 cup has 2.5-3.5 g of protein.  Sour cream - 1/2 cup has 2.5 g of protein.  Spinach (cooked) - 1/2 cup has 3 g of protein.  Squash (cooked) - 1/2 cup has 1.5 g of protein.  Actual amounts of protein may be different depending on processing. Talk with your health care provider or dietitian about what foods are recommended for you. This information is not intended to replace advice given to you by your health care provider. Make sure you discuss any questions you have with your health care provider. Document Released: 08/03/2015 Document Revised: 01/13/2016 Document Reviewed: 01/13/2016 Elsevier Interactive Patient Education  [employer].

## 2017-11-29 NOTE — Patient Instructions (Signed)
Please head downstairs for lab work. If any of your test results are critically abnormal, you will be contacted right away. Otherwise, I will contact you within a week about your test results and any recommendations for abnormalities.  I have placed a referral to [pulmonology for lung cancer screening. Our office will begin processing this referral. Please follow up if you have not heard anything about this referral within 10 days.  I will plan to see you back in 6 months for routine follow up, or sooner if needed.   Health Maintenance, Male A healthy lifestyle and preventive care is important for your health and wellness. Ask your health care provider about what schedule of regular examinations is right for you. What should I know about weight and diet? Eat a Healthy Diet  Eat plenty of vegetables, fruits, whole grains, low-fat dairy products, and lean protein.  Do not eat a lot of foods high in solid fats, added sugars, or salt.  Maintain a Healthy Weight Regular exercise can help you achieve or maintain a healthy weight. You should:  Do at least 150 minutes of exercise each week. The exercise should increase your heart rate and make you sweat (moderate-intensity exercise).  Do strength-training exercises at least twice a week.  Watch Your Levels of Cholesterol and Blood Lipids  Have your blood tested for lipids and cholesterol every 5 years starting at 61 years of age. If you are at high risk for heart disease, you should start having your blood tested when you are 61 years old. You may need to have your cholesterol levels checked more often if: ? Your lipid or cholesterol levels are high. ? You are older than 61 years of age. ? You are at high risk for heart disease.  What should I know about cancer screening? Many types of cancers can be detected early and may often be prevented. Lung Cancer  You should be screened every year for lung cancer if: ? You are a current smoker  who has smoked for at least 30 years. ? You are a former smoker who has quit within the past 15 years.  Talk to your health care provider about your screening options, when you should start screening, and how often you should be screened.  Colorectal Cancer  Routine colorectal cancer screening usually begins at 61 years of age and should be repeated every 5-10 years until you are 61 years old. You may need to be screened more often if early forms of precancerous polyps or small growths are found. Your health care provider may recommend screening at an earlier age if you have risk factors for colon cancer.  Your health care provider may recommend using home test kits to check for hidden blood in the stool.  A small camera at the end of a tube can be used to examine your colon (sigmoidoscopy or colonoscopy). This checks for the earliest forms of colorectal cancer.  Prostate and Testicular Cancer  Depending on your age and overall health, your health care provider may do certain tests to screen for prostate and testicular cancer.  Talk to your health care provider about any symptoms or concerns you have about testicular or prostate cancer.  Skin Cancer  Check your skin from head to toe regularly.  Tell your health care provider about any new moles or changes in moles, especially if: ? There is a change in a mole's size, shape, or color. ? You have a mole that is larger than a  pencil eraser.  Always use sunscreen. Apply sunscreen liberally and repeat throughout the day.  Protect yourself by wearing long sleeves, pants, a wide-brimmed hat, and sunglasses when outside.  What should I know about heart disease, diabetes, and high blood pressure?  If you are 65-22 years of age, have your blood pressure checked every 3-5 years. If you are 34 years of age or older, have your blood pressure checked every year. You should have your blood pressure measured twice-once when you are at a hospital or  clinic, and once when you are not at a hospital or clinic. Record the average of the two measurements. To check your blood pressure when you are not at a hospital or clinic, you can use: ? An automated blood pressure machine at a pharmacy. ? A home blood pressure monitor.  Talk to your health care provider about your target blood pressure.  If you are between 40-44 years old, ask your health care provider if you should take aspirin to prevent heart disease.  Have regular diabetes screenings by checking your fasting blood sugar level. ? If you are at a normal weight and have a low risk for diabetes, have this test once every three years after the age of 24. ? If you are overweight and have a high risk for diabetes, consider being tested at a younger age or more often.  A one-time screening for abdominal aortic aneurysm (AAA) by ultrasound is recommended for men aged 61-75 years who are current or former smokers. What should I know about preventing infection? Hepatitis B If you have a higher risk for hepatitis B, you should be screened for this virus. Talk with your health care provider to find out if you are at risk for hepatitis B infection. Hepatitis C Blood testing is recommended for:  Everyone born from [birth year] through 1965.  Anyone with known risk factors for hepatitis C.  Sexually Transmitted Diseases (STDs)  You should be screened each year for STDs including gonorrhea and chlamydia if: ? You are sexually active and are younger than 61 years of age. ? You are older than 61 years of age and your health care provider tells you that you are at risk for this type of infection. ? Your sexual activity has changed since you were last screened and you are at an increased risk for chlamydia or gonorrhea. Ask your health care provider if you are at risk.  Talk with your health care provider about whether you are at high risk of being infected with HIV. Your health care provider may recommend a  prescription medicine to help prevent HIV infection.  What else can I do?  Schedule regular health, dental, and eye exams.  Stay current with your vaccines (immunizations).  Do not use any tobacco products, such as cigarettes, chewing tobacco, and e-cigarettes. If you need help quitting, ask your health care provider.  Limit alcohol intake to no more than 2 drinks per day. One drink equals 12 ounces of beer, 5 ounces of wine, or 1 ounces of hard liquor.  Do not use street drugs.  Do not share needles.  Ask your health care provider for help if you need support or information about quitting drugs.  Tell your health care provider if you often feel depressed.  Tell your health care provider if you have ever been abused or do not feel safe at home. This information is not intended to replace advice given to you by your health care provider. Make sure  you discuss any questions you have with your health care provider. Document Released: 10/31/2007 Document Revised: 01/01/2016 Document Reviewed: 02/05/2015 Elsevier Interactive Patient Education  [employer].

## 2017-11-29 NOTE — Assessment & Plan Note (Signed)
Continue current medications Update A1c RTC in 6 months for routine follow up, sooner if needed - Microalbumin / creatinine urine ratio; Future - Hemoglobin A1c; Future - metFORMIN (GLUCOPHAGE) 1000 MG tablet; Take 1 tablet (1,000 mg total) by mouth 2 (two) times daily with a meal.  Dispense: 180 tablet; Refill: 2

## 2017-11-29 NOTE — Assessment & Plan Note (Signed)
Stable Continue current medication Continue to monitor  - TSH; Future - CBC; Future - amLODipine (NORVASC) 10 MG tablet; Take 1 tablet (10 mg total) by mouth daily. Annual appt is due must see provider for future refills  Dispense: 90 tablet; Refill: 3

## 2017-11-29 NOTE — Assessment & Plan Note (Addendum)
-  USPSTF grade A and B recommendations reviewed with patient; age-appropriate recommendations, preventive care, screening tests, etc discussed and encouraged; healthy living encouraged; see AVS for patient education given to patient -Discussed importance of 150 minutes of physical activity weekly,  eat 6 servings of fruit/vegetables daily and drink plenty of water and avoid sweet beverages.  -Follow up and care instructions discussed and provided in AVS. -recommend 81mg  asa daily for ASCVD risk reduction  -Reviewed Health Maintenance: Discussed the importance of colon cancer screening in the early detection and prevention of colon cancer but he declines  - Microalbumin / creatinine urine ratio; Future - Hemoglobin A1c; Future Encounter for hepatitis C virus screening test for high risk patient- Hepatitis C antibody; Future Screening for HIV (human immunodeficiency virus)- HIV antibody; Future Encounter for screening for lung cancer- Ambulatory Referral for Lung Cancer Scre Need for Tdap vaccination- Tdap vaccine greater than or equal to 7yo IM Need for 23-polyvalent pneumococcal polysaccharide vaccine- Pneumococcal polysaccharide vaccine 23-valent greater than or equal to 2yo subcutaneous/IM

## 2017-11-29 NOTE — Assessment & Plan Note (Signed)
Stable Continue rosuvastatin at current dosage Lipid panel up to date - TSH; Future - CBC; Future

## 2017-11-29 NOTE — Progress Notes (Addendum)
Subjective:   Benjamin Serrano is a 62 y.o. male who presents for an Initial Medicare Annual Wellness Visit.  Review of Systems  No ROS.  Medicare Wellness Visit. Additional risk factors are reflected in the social history.  Cardiac Risk Factors include: advanced age (>74mn, >>83women);diabetes mellitus;dyslipidemia;hypertension;male gender Sleep patterns: has frequent nighttime awakenings, gets up 2-3 times nightly to void and sleeps 6-7 hours nightly.  Patient reports insomnia issues, discussed recommended sleep tips.   Home Safety/Smoke Alarms: Feels safe in home. Smoke alarms in place.  Living environment; residence and Firearm Safety: 1-story house/ trailer, no firearms. Lives with mother, no needs for DME, good support system Seat Belt Safety/Bike Helmet: Wears seat belt.   PSA-  Lab Results  Component Value Date   PSA 2.73 05/28/2017      Objective:    Today's Vitals   11/29/17 0928 11/29/17 0933  BP: (!) 153/82   Pulse: 75   Resp: 18   Temp: 97.9 F (36.6 C)   SpO2: 99%   Weight: 205 lb (93 kg)   Height: '5\' 8"'  (1.727 m)   PainSc:  1    Body mass index is 31.17 kg/m.  Advanced Directives 11/29/2017  Does Patient Have a Medical Advance Directive? No  Would patient like information on creating a medical advance directive? Yes (ED - Information included in AVS)    Current Medications (verified) Outpatient Encounter Medications as of 11/29/2017  Medication Sig  . ammonium lactate (LAC-HYDRIN) 12 % cream Apply topically as needed for dry skin.  . Blood Glucose Monitoring Suppl (ONE TOUCH ULTRA 2) w/Device KIT Use to check blood sugars twice a day  . fish oil-omega-3 fatty acids 1000 MG capsule Take 1 g by mouth 2 (two) times daily.  .Marland KitchenglipiZIDE (GLUCOTROL) 10 MG tablet Take 1 tablet (10 mg total) by mouth daily. Annual appt is due must see provider for future refills  . glucose blood (ONE TOUCH ULTRA TEST) test strip Use to check blood sugars twice a day  .  ONETOUCH DELICA LANCETS 373ZMISC Use to check blood sugars 2 times daily  . rosuvastatin (CRESTOR) 20 MG tablet Take 1 tablet (20 mg total) by mouth daily.  . tamsulosin (FLOMAX) 0.4 MG CAPS capsule Take 0.4 mg by mouth.  . [DISCONTINUED] amLODipine (NORVASC) 10 MG tablet Take 1 tablet (10 mg total) by mouth daily. Annual appt is due must see provider for future refills  . [DISCONTINUED] metFORMIN (GLUCOPHAGE) 1000 MG tablet Take 1 tablet (1,000 mg total) by mouth 2 (two) times daily with a meal.   No facility-administered encounter medications on file as of 11/29/2017.     Allergies (verified) Patient has no known allergies.   History: Past Medical History:  Diagnosis Date  . Asthma   . Depression   . Diabetes mellitus without complication ([place]   . Hyperlipemia   . Hypertension    Past Surgical History:  Procedure Laterality Date  . Arm surgery Right   . LEG SURGERY     Family History  Problem Relation Age of Onset  . Diabetes Mother   . Alzheimer's disease Maternal Grandmother   . Hypertension Maternal Grandmother    Social History   Socioeconomic History  . Marital status: Widowed    Spouse name: Not on file  . Number of children: 1  . Years of education: 920 . Highest education level: Not on file  Occupational History  . Occupation: Disability    Comment: Mental  health  Social Needs  . Financial resource strain: Not hard at all  . Food insecurity:    Worry: Never true    Inability: Never true  . Transportation needs:    Medical: No    Non-medical: No  Tobacco Use  . Smoking status: Current Every Day Smoker    Packs/day: 0.25    Types: Cigarettes  . Smokeless tobacco: Never Used  Substance and Sexual Activity  . Alcohol use: No    Comment: social  . Drug use: No  . Sexual activity: Not Currently  Lifestyle  . Physical activity:    Days per week: 3 days    Minutes per session: 30 min  . Stress: Only a little  Relationships  . Social connections:     Talks on phone: More than three times a week    Gets together: More than three times a week    Attends religious service: Not on file    Active member of club or organization: Not on file    Attends meetings of clubs or organizations: Not on file    Relationship status: Widowed  Other Topics Concern  . Not on file  Social History Narrative   Fun/Hobby: Oceanographer.    Tobacco Counseling Ready to quit: Not Answered Counseling given: Not Answered   Activities of Daily Living In your present state of health, do you have any difficulty performing the following activities: 11/29/2017  Hearing? N  Vision? N  Difficulty concentrating or making decisions? N  Walking or climbing stairs? N  Dressing or bathing? N  Doing errands, shopping? N  Preparing Food and eating ? N  Using the Toilet? N  In the past six months, have you accidently leaked urine? N  Do you have problems with loss of bowel control? N  Managing your Medications? N  Managing your Finances? N  Housekeeping or managing your Housekeeping? N  Some recent data might be hidden     Immunizations and Health Maintenance Immunization History  Administered Date(s) Administered  . Pneumococcal Polysaccharide-23 11/29/2017  . Tdap 11/29/2017   Health Maintenance Due  Topic Date Due  . Hepatitis C Screening  Jul 12, 1955  . URINE MICROALBUMIN  09/23/2017  . HEMOGLOBIN A1C  11/25/2017    Patient Care Team: Lance Sell, NP as PCP - General (Nurse Practitioner) Warden Fillers, MD as Consulting Physician (Ophthalmology) Gardiner Barefoot, DPM as Consulting Physician (Podiatry)  Indicate any recent Medical Services you may have received from other than [facility] providers in the past year (date may be approximate).    Assessment:   This is a routine wellness examination for Benjamin Serrano. Physical assessment deferred to PCP.   Hearing/Vision screen Hearing Screening Comments: Able to hear conversational tones w/o  difficulty. No issues reported.  Passed whisper test Vision Screening Comments: appointment yearly Dr. Katy Fitch  Dietary issues and exercise activities discussed: Current Exercise Habits: Home exercise routine, Type of exercise: walking, Time (Minutes): 30, Frequency (Times/Week): 3, Weekly Exercise (Minutes/Week): 90, Intensity: Mild, Exercise limited by: orthopedic condition(s)  Diet (meal preparation, eat out, water intake, caffeinated beverages, dairy products, fruits and vegetables): in general, a "healthy" diet     Reviewed heart healthy and diabetic diet, Encouraged patient to increase daily water and fluid intake. Discussed with patient not to skip meal and to drink glucerna if he felt he did not want to eat ( glucerna samples and coupons provided).  Goals    . Patient Stated     Watch  how many carbohydrates I eat daily. Drink glucerna and eat more routinely.      Depression Screen PHQ 2/9 Scores 11/29/2017  PHQ - 2 Score 2  PHQ- 9 Score 4    Fall Risk Fall Risk  11/29/2017  Falls in the past year? No     Cognitive Function: MMSE - Mini Mental State Exam 11/29/2017  Not completed: Refused        Screening Tests Health Maintenance  Topic Date Due  . Hepatitis C Screening  18-Oct-1955  . URINE MICROALBUMIN  09/23/2017  . HEMOGLOBIN A1C  11/25/2017  . COLONOSCOPY  08/31/2018 (Originally 06/11/2006)  . INFLUENZA VACCINE  12/16/2017  . FOOT EXAM  08/31/2018  . OPHTHALMOLOGY EXAM  10/08/2018  . PNEUMOCOCCAL POLYSACCHARIDE VACCINE (2) 11/30/2022  . TETANUS/TDAP  11/30/2027  . HIV Screening  Completed      Plan:   Continue doing brain stimulating activities (puzzles, reading, adult coloring books, staying active) to keep memory sharp.   Continue to eat heart healthy diet (full of fruits, vegetables, whole grains, lean protein, water--limit salt, fat, and sugar intake) and increase physical activity as tolerated.  I have personally reviewed and noted the following in  the patient's chart:   . Medical and social history . Use of alcohol, tobacco or illicit drugs  . Current medications and supplements . Functional ability and status . Nutritional status . Physical activity . Advanced directives . List of other physicians . Vitals . Screenings to include cognitive, depression, and falls . Referrals and appointments  In addition, I have reviewed and discussed with patient certain preventive protocols, quality metrics, and best practice recommendations. A written personalized care plan for preventive services as well as general preventive health recommendations were provided to patient.     Michiel Cowboy, RN   11/29/2017   Medical screening examination/treatment/procedure(s) were performed by the [employer], RN. As primary care provider I was immediately available for consulation/collaboration. I agree with above documentation. Caesar Chestnut, NP

## 2017-11-30 ENCOUNTER — Other Ambulatory Visit: Payer: Self-pay | Admitting: Nurse Practitioner

## 2017-11-30 DIAGNOSIS — E119 Type 2 diabetes mellitus without complications: Secondary | ICD-10-CM

## 2017-11-30 LAB — HIV ANTIBODY (ROUTINE TESTING W REFLEX): HIV: NONREACTIVE

## 2017-11-30 LAB — HEPATITIS C ANTIBODY
HEP C AB: NONREACTIVE
SIGNAL TO CUT-OFF: 0.01 (ref <1.00)

## 2017-12-02 ENCOUNTER — Other Ambulatory Visit: Payer: Self-pay | Admitting: Acute Care

## 2017-12-02 DIAGNOSIS — F17210 Nicotine dependence, cigarettes, uncomplicated: Secondary | ICD-10-CM

## 2017-12-02 DIAGNOSIS — Z122 Encounter for screening for malignant neoplasm of respiratory organs: Secondary | ICD-10-CM

## 2017-12-10 ENCOUNTER — Encounter: Payer: Self-pay | Admitting: Acute Care

## 2017-12-10 ENCOUNTER — Ambulatory Visit (INDEPENDENT_AMBULATORY_CARE_PROVIDER_SITE_OTHER)
Admission: RE | Admit: 2017-12-10 | Discharge: 2017-12-10 | Disposition: A | Discharge status: Home / Self Care | Payer: Medicare HMO | Source: Ambulatory Visit | Attending: Acute Care | Admitting: Acute Care

## 2017-12-10 ENCOUNTER — Ambulatory Visit (INDEPENDENT_AMBULATORY_CARE_PROVIDER_SITE_OTHER): Payer: Medicare HMO | Admitting: Acute Care

## 2017-12-10 DIAGNOSIS — F17210 Nicotine dependence, cigarettes, uncomplicated: Secondary | ICD-10-CM

## 2017-12-10 DIAGNOSIS — R69 Illness, unspecified: Secondary | ICD-10-CM | POA: Diagnosis not present

## 2017-12-10 DIAGNOSIS — Z122 Encounter for screening for malignant neoplasm of respiratory organs: Secondary | ICD-10-CM

## 2017-12-10 NOTE — Progress Notes (Signed)
Shared Decision Making Visit Lung Cancer Screening Program [phone])   Eligibility:  Age 62 y.o.  Pack Years Smoking History Calculation 86 pack year smoking history (# packs/per year x # years smoked)  Recent History of coughing up blood  no  Unexplained weight loss? no ( >Than 15 pounds within the last 6 months )  Prior History Lung / other cancer no (Diagnosis within the last 5 years already requiring surveillance chest CT Scans).  Smoking Status Current Smoker  Former Smokers: Years since quit: NA  Quit Date: NA  Visit Components:  Discussion included one or more decision making aids. yes  Discussion included risk/benefits of screening. yes  Discussion included potential follow up diagnostic testing for abnormal scans. yes  Discussion included meaning and risk of over diagnosis. yes  Discussion included meaning and risk of False Positives. yes  Discussion included meaning of total radiation exposure. yes  Counseling Included:  Importance of adherence to annual lung cancer LDCT screening. yes  Impact of comorbidities on ability to participate in the program. yes  Ability and willingness to under diagnostic treatment. yes  Smoking Cessation Counseling:  Current Smokers:   Discussed importance of smoking cessation. yes  Information about tobacco cessation classes and interventions provided to patient. yes  Patient provided with "ticket" for LDCT Scan. yes  Symptomatic Patient. no  Counseling  Diagnosis Code: Tobacco Use Z72.0  Asymptomatic Patient yes  Counseling (Intermediate counseling: > three minutes counseling) D2202  Former Smokers:   Discussed the importance of maintaining cigarette abstinence. yes  Diagnosis Code: Personal History of Nicotine Dependence. R42.706  Information about tobacco cessation classes and interventions provided to patient. Yes  Patient provided with "ticket" for LDCT Scan. yes  Written Order for Lung Cancer  Screening with LDCT placed in Epic. Yes (CT Chest Lung Cancer Screening Low Dose W/O CM) [id number] Z12.2-Screening of respiratory organs Z87.891-Personal history of nicotine dependence  I have spent 25 minutes of face to face time with Mr. And Benjamin Serrano discussing the risks and benefits of lung cancer screening. We viewed a power point together that explained in detail the above noted topics. We paused at intervals to allow for questions to be asked and answered to ensure understanding.We discussed that the single most powerful action that he can take to decrease his risk of developing lung cancer is to quit smoking. We discussed whether or not he is ready to commit to setting a quit date. He is currently not ready to set a quit date.We discussed options for tools to aid in quitting smoking including nicotine replacement therapy, non-nicotine medications, support groups, Quit Smart classes, and behavior modification. We discussed that often times setting smaller, more achievable goals, such as eliminating 1 cigarette a day for a week and then 2 cigarettes a day for a week can be helpful in slowly decreasing the number of cigarettes smoked. This allows for a sense of accomplishment as well as providing a clinical benefit. I gave him the " Be Stronger Than Your Excuses" card with contact information for community resources, classes, free nicotine replacement therapy, and access to mobile apps, text messaging, and on-line smoking cessation help. I have also given him my card and contact information in the event he needs to contact me. We discussed the time and location of the scan, and that either [name] or I will call with the results within 24-48 hours of receiving them. I have offered him  a copy of the power point we viewed  as a resource in the event they need reinforcement of the concepts we discussed today in the office. The patient verbalized understanding of all of  the above and had no  further questions upon leaving the office. They have my contact information in the event they have any further questions.  I spent 5 minutes counseling on smoking cessation and the health risks of continued tobacco abuse.  I explained to the patient that there has been a high incidence of coronary artery disease noted on these exams. I explained that this is a non-gated exam therefore degree or severity cannot be determined. This patient is currently on statin therapy. I have asked the patient to follow-up with their PCP regarding any incidental finding of coronary artery disease and management with diet or medication as their PCP  feels is clinically indicated. The patient verbalized understanding of the above and had no further questions upon completion of the visit.      Benjamin Spatz, NP 12/10/2017 3:37 PM

## 2017-12-17 ENCOUNTER — Other Ambulatory Visit: Payer: Self-pay | Admitting: Acute Care

## 2017-12-17 DIAGNOSIS — Z122 Encounter for screening for malignant neoplasm of respiratory organs: Secondary | ICD-10-CM

## 2017-12-17 DIAGNOSIS — F17210 Nicotine dependence, cigarettes, uncomplicated: Secondary | ICD-10-CM

## 2017-12-20 ENCOUNTER — Other Ambulatory Visit: Payer: Self-pay | Admitting: Nurse Practitioner

## 2017-12-20 DIAGNOSIS — E119 Type 2 diabetes mellitus without complications: Secondary | ICD-10-CM

## 2018-01-04 DIAGNOSIS — R69 Illness, unspecified: Secondary | ICD-10-CM | POA: Diagnosis not present

## 2018-01-25 ENCOUNTER — Telehealth: Payer: Self-pay | Admitting: Nurse Practitioner

## 2018-01-25 DIAGNOSIS — I10 Essential (primary) hypertension: Secondary | ICD-10-CM

## 2018-01-25 NOTE — Telephone Encounter (Signed)
Pt is changing is pharmacy to [facility]. Asked pt to call [facility] and have them request the prescriptions to be transferred to them. Pt voiced understanding.

## 2018-01-25 NOTE — Telephone Encounter (Signed)
Copied from [place] [id number]. Topic: Quick Communication - Rx Refill/Question >> Jan 25, 2018  9:54 AM Reyne Dumas L wrote: Medication: amLODipine (NORVASC) 10 MG tablet  Has the patient contacted their pharmacy? Yes - states new script needed (Agent: If no, request that the patient contact the pharmacy for the refill.) (Agent: If yes, when and what did the pharmacy advise?)  Preferred Pharmacy (with phone number or street name): [place], [place] - [address] [phone] (Phone) [phone] (Fax)  Agent: Please be advised that RX refills may take up to 3 business days. We ask that you follow-up with your pharmacy.

## 2018-01-26 MED ORDER — AMLODIPINE BESYLATE 10 MG PO TABS: 10.0000 mg | ORAL_TABLET | Freq: Every day | ORAL | 2 refills | Status: DC

## 2018-01-26 NOTE — Telephone Encounter (Signed)
Refill sent. See meds.  

## 2018-02-18 ENCOUNTER — Other Ambulatory Visit: Payer: Self-pay | Admitting: Nurse Practitioner

## 2018-02-21 DIAGNOSIS — R69 Illness, unspecified: Secondary | ICD-10-CM | POA: Diagnosis not present

## 2018-03-24 ENCOUNTER — Other Ambulatory Visit: Payer: Self-pay | Admitting: Nurse Practitioner

## 2018-04-02 DIAGNOSIS — R69 Illness, unspecified: Secondary | ICD-10-CM | POA: Diagnosis not present

## 2018-05-23 DIAGNOSIS — R69 Illness, unspecified: Secondary | ICD-10-CM | POA: Diagnosis not present

## 2018-05-30 ENCOUNTER — Ambulatory Visit (INDEPENDENT_AMBULATORY_CARE_PROVIDER_SITE_OTHER): Payer: Medicare HMO | Admitting: Nurse Practitioner

## 2018-05-30 ENCOUNTER — Encounter: Payer: Self-pay | Admitting: Nurse Practitioner

## 2018-05-30 VITALS — BP 120/78 | HR 64 | Ht 68.0 in | Wt 212.0 lb

## 2018-05-30 DIAGNOSIS — Z23 Encounter for immunization: Secondary | ICD-10-CM | POA: Diagnosis not present

## 2018-05-30 DIAGNOSIS — E1140 Type 2 diabetes mellitus with diabetic neuropathy, unspecified: Secondary | ICD-10-CM | POA: Diagnosis not present

## 2018-05-30 DIAGNOSIS — Z7984 Long term (current) use of oral hypoglycemic drugs: Secondary | ICD-10-CM | POA: Diagnosis not present

## 2018-05-30 DIAGNOSIS — E119 Type 2 diabetes mellitus without complications: Secondary | ICD-10-CM | POA: Diagnosis not present

## 2018-05-30 DIAGNOSIS — N529 Male erectile dysfunction, unspecified: Secondary | ICD-10-CM | POA: Diagnosis not present

## 2018-05-30 DIAGNOSIS — Z6834 Body mass index (BMI) 34.0-34.9, adult: Secondary | ICD-10-CM | POA: Diagnosis not present

## 2018-05-30 DIAGNOSIS — E669 Obesity, unspecified: Secondary | ICD-10-CM | POA: Diagnosis not present

## 2018-05-30 DIAGNOSIS — E785 Hyperlipidemia, unspecified: Secondary | ICD-10-CM | POA: Diagnosis not present

## 2018-05-30 DIAGNOSIS — I10 Essential (primary) hypertension: Secondary | ICD-10-CM | POA: Diagnosis not present

## 2018-05-30 DIAGNOSIS — R69 Illness, unspecified: Secondary | ICD-10-CM | POA: Diagnosis not present

## 2018-05-30 DIAGNOSIS — N400 Enlarged prostate without lower urinary tract symptoms: Secondary | ICD-10-CM | POA: Diagnosis not present

## 2018-05-30 LAB — POCT GLYCOSYLATED HEMOGLOBIN (HGB A1C): Hemoglobin A1C: 7 % — AB (ref 4.0–5.6)

## 2018-05-30 MED ORDER — GLIPIZIDE 5 MG PO TABS: ORAL_TABLET | ORAL | 1 refills | Status: DC

## 2018-05-30 MED ORDER — METFORMIN HCL 1000 MG PO TABS: 1000.0000 mg | ORAL_TABLET | Freq: Two times a day (BID) | ORAL | 2 refills | Status: DC

## 2018-05-30 MED ORDER — GLIPIZIDE 10 MG PO TABS: ORAL_TABLET | ORAL | 2 refills | Status: DC

## 2018-05-30 NOTE — Assessment & Plan Note (Signed)
Stable.  Continue current medication

## 2018-05-30 NOTE — Progress Notes (Signed)
Benjamin Serrano is a 63 y.o. male with the following history as recorded in EpicCare:  Patient Active Problem List   Diagnosis Date Noted  . Routine general medical examination at a health care facility 11/29/2017  . Erectile dysfunction 05/29/2017  . Type 2 diabetes mellitus ([place]) 09/23/2016  . Hyperlipidemia 09/23/2016  . Essential hypertension 09/23/2016    Current Outpatient Medications  Medication Sig Dispense Refill  . amLODipine (NORVASC) 10 MG tablet Take 1 tablet (10 mg total) by mouth daily. 90 tablet 2  . ammonium lactate (LAC-HYDRIN) 12 % cream Apply topically as needed for dry skin. 385 g 0  . Blood Glucose Monitoring Suppl (ONE TOUCH ULTRA 2) w/Device KIT Use to check blood sugars twice a day 1 each 0  . fish oil-omega-3 fatty acids 1000 MG capsule Take 1 g by mouth 2 (two) times daily.    Marland Kitchen glipiZIDE (GLUCOTROL) 5 MG tablet TAKE 1 TABLET BY MOUTH  DAILY . 30 tablet 1  . glucose blood (ONE TOUCH ULTRA TEST) test strip USE AS DIRECTED TWICE DAILY 100 each 3  . metFORMIN (GLUCOPHAGE) 1000 MG tablet Take 1 tablet (1,000 mg total) by mouth 2 (two) times daily with a meal. 180 tablet 2  . ONETOUCH DELICA LANCETS 46E MISC Use to check blood sugars 2 times daily 100 each 5  . rosuvastatin (CRESTOR) 20 MG tablet TAKE 1 TABLET BY MOUTH ONCE DAILY 90 tablet 1  . tamsulosin (FLOMAX) 0.4 MG CAPS capsule Take 0.4 mg by mouth.     No current facility-administered medications for this visit.     Allergies: Patient has no known allergies.  Past Medical History:  Diagnosis Date  . Asthma   . Depression   . Diabetes mellitus without complication ([place])   . Hyperlipemia   . Hypertension     Past Surgical History:  Procedure Laterality Date  . Arm surgery Right   . LEG SURGERY      Family History  Problem Relation Age of Onset  . Diabetes Mother   . Alzheimer's disease Maternal Grandmother   . Hypertension Maternal Grandmother     Social History   Tobacco Use  . Smoking  status: Current Every Day Smoker    Packs/day: 2.00    Years: 43.00    Pack years: 86.00    Types: Cigarettes  . Smokeless tobacco: Never Used  Substance Use Topics  . Alcohol use: No    Comment: social     Subjective:  [name] is here today for routine follow up of diabetes and hypertension.  Diabetes- maintained on metformin 1000 BID, glipizide 10  Reports daily medication compliance, taking metformin bid and glipizide qam without missed doses. He tells me that his recent home readings have been 60s- 180s, sometimes his AM readings are in the 60s and he "feels a little weak and shaky" but better once he eats. He does eat 3 meals a day, but says "I do not eat as much as I used to." also says he has "been eating more cookies" when his sugar is lower which makes him feel better. Denies  polyuria, polydipsia, polyphagia.  Lab Results  Component Value Date   HGBA1C 7.0 (A) 05/30/2018    Hypertension -maintained on amlodipine 10. Reports daily medication compliance without noted adverse medication effects. Reports he does not check blood pressure regularly at home  No headaches, vision changes, chest pain, shortness of breath, edema.  BP Readings from Last 3 Encounters:  05/30/18 120/78  11/29/17 126/74  11/29/17 (!) 153/82    ROS- See HPI   Objective:  Vitals:   05/30/18 0925  BP: 120/78  Pulse: 64  SpO2: 99%  Weight: 212 lb (96.2 kg)  Height: '5\' 8"'  (1.727 m)    General: Well developed, well nourished, in no acute distress  Skin : Warm and dry.  Head: Normocephalic and atraumatic  Eyes: Sclera and conjunctiva clear; pupils round and reactive to light; extraocular movements intact l  Oropharynx: Pink, supple. No suspicious lesions  Neck: Supple Lungs: Respirations unlabored; clear to auscultation bilaterally without wheeze, rales, rhonchi  CVS exam: normal rate, regular rhythm, normal S1, S2, no murmurs, rubs, clicks or gallops.  Extremities: No edema,  cyanosis, clubbing  Vessels: Symmetric bilaterally  Neurologic: Alert and oriented; speech intact; face symmetrical; moves all extremities well; CNII-XII intact without focal deficit  Psychiatric: Normal mood and affect.   Assessment:  1. Need for influenza vaccination   2. Type 2 diabetes mellitus without complication, without long-term current use of insulin ([place])   3. Essential hypertension     Plan:   Need for influenza vaccination- Flu Vaccine QUAD 36+ mos IM   Return in about 1 month (around 06/30/2018) for diabetes follow up- check glucose log.  Orders Placed This Encounter  Procedures  . Flu Vaccine QUAD 36+ mos IM  . POCT glycosylated hemoglobin (Hb A1C)    Requested Prescriptions   Signed Prescriptions Disp Refills  . metFORMIN (GLUCOPHAGE) 1000 MG tablet 180 tablet 2    Sig: Take 1 tablet (1,000 mg total) by mouth 2 (two) times daily with a meal.  . glipiZIDE (GLUCOTROL) 5 MG tablet 30 tablet 1    Sig: TAKE 1 TABLET BY MOUTH  DAILY .   '

## 2018-05-30 NOTE — Assessment & Plan Note (Signed)
Due to hypoglycemia, will reduce glipizide dosage to 5 daily, continue metformin, medication dosing and side effects discussed Discussed the role of healthy diet and exercise in the management of diabetes, additional education provided on AVS RTC in 1 month with glucose logs to check response - metFORMIN (GLUCOPHAGE) 1000 MG tablet; Take 1 tablet (1,000 mg total) by mouth 2 (two) times daily with a meal.  Dispense: 180 tablet; Refill: 2 - POCT glycosylated hemoglobin (Hb A1C) - glipiZIDE (GLUCOTROL) 5 MG tablet; TAKE 1 TABLET BY MOUTH  DAILY .  Dispense: 30 tablet; Refill: 1

## 2018-05-30 NOTE — Patient Instructions (Addendum)
Reduce your glipizide dosage to 5mg  once daily Continue your metformin. Please record your blood sugars in a log twice daily, once in the morning when you first wake up up before eating anything and then again 2 hours after dinner or before bedtime. I will plan to see you back in about 1 month with your glucose logs    Diabetes Mellitus and Nutrition, Adult When you have diabetes (diabetes mellitus), it is very important to have healthy eating habits because your blood sugar (glucose) levels are greatly affected by what you eat and drink. Eating healthy foods in the appropriate amounts, at about the same times every day, can help you:  Control your blood glucose.  Lower your risk of heart disease.  Improve your blood pressure.  Reach or maintain a healthy weight. Every person with diabetes is different, and each person has different needs for a meal plan. Your health care provider may recommend that you work with a diet and nutrition specialist (dietitian) to make a meal plan that is best for you. Your meal plan may vary depending on factors such as:  The calories you need.  The medicines you take.  Your weight.  Your blood glucose, blood pressure, and cholesterol levels.  Your activity level.  Other health conditions you have, such as heart or kidney disease. How do carbohydrates affect me? Carbohydrates, also called carbs, affect your blood glucose level more than any other type of food. Eating carbs naturally raises the amount of glucose in your blood. Carb counting is a method for keeping track of how many carbs you eat. Counting carbs is important to keep your blood glucose at a healthy level, especially if you use insulin or take certain oral diabetes medicines. It is important to know how many carbs you can safely have in each meal. This is different for every person. Your dietitian can help you calculate how many carbs you should have at each meal and for each snack. Foods  that contain carbs include:  Bread, cereal, rice, pasta, and crackers.  Potatoes and corn.  Peas, beans, and lentils.  Milk and yogurt.  Fruit and juice.  Desserts, such as cakes, cookies, ice cream, and candy. How does alcohol affect me? Alcohol can cause a sudden decrease in blood glucose (hypoglycemia), especially if you use insulin or take certain oral diabetes medicines. Hypoglycemia can be a life-threatening condition. Symptoms of hypoglycemia (sleepiness, dizziness, and confusion) are similar to symptoms of having too much alcohol. If your health care provider says that alcohol is safe for you, follow these guidelines:  Limit alcohol intake to no more than 1 drink per day for nonpregnant women and 2 drinks per day for men. One drink equals 12 oz of beer, 5 oz of wine, or 1 oz of hard liquor.  Do not drink on an empty stomach.  Keep yourself hydrated with water, diet soda, or unsweetened iced tea.  Keep in mind that regular soda, juice, and other mixers may contain a lot of sugar and must be counted as carbs. What are tips for following this plan?  Reading food labels  Start by checking the serving size on the "Nutrition Facts" label of packaged foods and drinks. The amount of calories, carbs, fats, and other nutrients listed on the label is based on one serving of the item. Many items contain more than one serving per package.  Check the total grams (g) of carbs in one serving. You can calculate the number of servings of  carbs in one serving by dividing the total carbs by 15. For example, if a food has 30 g of total carbs, it would be equal to 2 servings of carbs.  Check the number of grams (g) of saturated and trans fats in one serving. Choose foods that have low or no amount of these fats.  Check the number of milligrams (mg) of salt (sodium) in one serving. Most people should limit total sodium intake to less than 2,300 mg per day.  Always check the nutrition  information of foods labeled as "low-fat" or "nonfat". These foods may be higher in added sugar or refined carbs and should be avoided.  Talk to your dietitian to identify your daily goals for nutrients listed on the label. Shopping  Avoid buying canned, premade, or processed foods. These foods tend to be high in fat, sodium, and added sugar.  Shop around the outside edge of the grocery store. This includes fresh fruits and vegetables, bulk grains, fresh meats, and fresh dairy. Cooking  Use low-heat cooking methods, such as baking, instead of high-heat cooking methods like deep frying.  Cook using healthy oils, such as olive, canola, or sunflower oil.  Avoid cooking with butter, cream, or high-fat meats. Meal planning  Eat meals and snacks regularly, preferably at the same times every day. Avoid going long periods of time without eating.  Eat foods high in fiber, such as fresh fruits, vegetables, beans, and whole grains. Talk to your dietitian about how many servings of carbs you can eat at each meal.  Eat 4-6 ounces (oz) of lean protein each day, such as lean meat, chicken, fish, eggs, or tofu. One oz of lean protein is equal to: ? 1 oz of meat, chicken, or fish. ? 1 egg. ?  cup of tofu.  Eat some foods each day that contain healthy fats, such as avocado, nuts, seeds, and fish. Lifestyle  Check your blood glucose regularly.  Exercise regularly as told by your health care provider. This may include: ? 150 minutes of moderate-intensity or vigorous-intensity exercise each week. This could be brisk walking, biking, or water aerobics. ? Stretching and doing strength exercises, such as yoga or weightlifting, at least 2 times a week.  Take medicines as told by your health care provider.  Do not use any products that contain nicotine or tobacco, such as cigarettes and e-cigarettes. If you need help quitting, ask your health care provider.  Work with a Social worker or diabetes educator  to identify strategies to manage stress and any emotional and social challenges. Questions to ask a health care provider  Do I need to meet with a diabetes educator?  Do I need to meet with a dietitian?  What number can I call if I have questions?  When are the best times to check my blood glucose? Where to find more information:  American Diabetes Association: diabetes.org  Academy of Nutrition and Dietetics: www.eatright.[employer] of Diabetes and Digestive and Kidney Diseases (NIH): [employer] Summary  A healthy meal plan will help you control your blood glucose and maintain a healthy lifestyle.  Working with a diet and nutrition specialist (dietitian) can help you make a meal plan that is best for you.  Keep in mind that carbohydrates (carbs) and alcohol have immediate effects on your blood glucose levels. It is important to count carbs and to use alcohol carefully. This information is not intended to replace advice given to you by your health care provider. Make sure you  discuss any questions you have with your health care provider. Document Released: 01/29/2005 Document Revised: 12/02/2016 Document Reviewed: 06/08/2016 Elsevier Interactive Patient Education  2019 [employer].

## 2018-06-30 ENCOUNTER — Ambulatory Visit: Payer: Medicare HMO | Admitting: Nurse Practitioner

## 2018-07-11 DIAGNOSIS — R69 Illness, unspecified: Secondary | ICD-10-CM | POA: Diagnosis not present

## 2018-07-13 ENCOUNTER — Encounter: Payer: Self-pay | Admitting: Nurse Practitioner

## 2018-07-13 ENCOUNTER — Ambulatory Visit (INDEPENDENT_AMBULATORY_CARE_PROVIDER_SITE_OTHER): Payer: Medicare HMO | Admitting: Nurse Practitioner

## 2018-07-13 VITALS — BP 128/80 | HR 71 | Ht 68.0 in | Wt 211.0 lb

## 2018-07-13 DIAGNOSIS — E119 Type 2 diabetes mellitus without complications: Secondary | ICD-10-CM | POA: Diagnosis not present

## 2018-07-13 NOTE — Progress Notes (Signed)
Benjamin Serrano is a 63 y.o. male with the following history as recorded in [employer]:  Patient Active Problem List   Diagnosis Date Noted  . Routine general medical examination at a health care facility 11/29/2017  . Erectile dysfunction 05/29/2017  . Type 2 diabetes mellitus ([place]) 09/23/2016  . Hyperlipidemia 09/23/2016  . Essential hypertension 09/23/2016    Current Outpatient Medications  Medication Sig Dispense Refill  . amLODipine (NORVASC) 10 MG tablet Take 1 tablet (10 mg total) by mouth daily. 90 tablet 2  . ammonium lactate (LAC-HYDRIN) 12 % cream Apply topically as needed for dry skin. 385 g 0  . Blood Glucose Monitoring Suppl (ONE TOUCH ULTRA 2) w/Device KIT Use to check blood sugars twice a day 1 each 0  . fish oil-omega-3 fatty acids 1000 MG capsule Take 1 g by mouth 2 (two) times daily.    Marland Kitchen glipiZIDE (GLUCOTROL) 5 MG tablet TAKE 1 TABLET BY MOUTH  DAILY . 30 tablet 1  . glucose blood (ONE TOUCH ULTRA TEST) test strip USE AS DIRECTED TWICE DAILY 100 each 3  . metFORMIN (GLUCOPHAGE) 1000 MG tablet Take 1 tablet (1,000 mg total) by mouth 2 (two) times daily with a meal. 180 tablet 2  . ONETOUCH DELICA LANCETS 50Y MISC Use to check blood sugars 2 times daily 100 each 5  . rosuvastatin (CRESTOR) 20 MG tablet TAKE 1 TABLET BY MOUTH ONCE DAILY 90 tablet 1  . tamsulosin (FLOMAX) 0.4 MG CAPS capsule Take 0.4 mg by mouth.     No current facility-administered medications for this visit.     Allergies: Patient has no known allergies.  Past Medical History:  Diagnosis Date  . Asthma   . Depression   . Diabetes mellitus without complication ([place])   . Hyperlipemia   . Hypertension     Past Surgical History:  Procedure Laterality Date  . Arm surgery Right   . LEG SURGERY      Family History  Problem Relation Age of Onset  . Diabetes Mother   . Alzheimer's disease Maternal Grandmother   . Hypertension Maternal Grandmother     Social History   Tobacco Use  . Smoking  status: Current Every Day Smoker    Packs/day: 2.00    Years: 43.00    Pack years: 86.00    Types: Cigarettes  . Smokeless tobacco: Never Used  Substance Use Topics  . Alcohol use: No    Comment: social     Subjective:  Mr Maring is here today for follow up of Diabetes- maintained on  Metformin 1000 BID, and due to reported episodes of hypoglycemia at his last OV on 05/30/18, we reduced his glipizide dosage from 10 to 5 daily along with discussion about healthy diet. Today, he says he has adjusted medications as directed and has not had any more low readings under 100 since adjustments, no more episodes of "feeling weak and shaky " as reported at last OV, trying to eat a healthy diet Brought home glucose log with him today, Glucose readings since last OV ranging from 100s- 150s, checking BID  Lab Results  Component Value Date   HGBA1C 7.0 (A) 05/30/2018   ROS- See HPI  Objective:  Vitals:   07/13/18 1019  BP: 128/80  Pulse: 71  SpO2: 96%  Weight: 211 lb (95.7 kg)  Height: _0  (1.727 m)    General: Well developed, well nourished, in no acute distress  Skin : Warm and dry.  Head: Normocephalic  and atraumatic  Eyes: Sclera and conjunctiva clear; pupils round and reactive to light; extraocular movements intact   Oropharynx: Pink, supple. No suspicious lesions  Neck: Supple   Lungs: Effort unlabored, no respiratory distress CVS exam: normal rate and regular rhythm.  Extremities: No edema, cyanosis, clubbing  Vessels: Symmetric bilaterally  Neurologic: Alert and oriented; speech intact; face symmetrical; moves all extremities well; CNII-XII intact without focal deficit  Psychiatric: Normal mood and affect.  Assessment:  1. Type 2 diabetes mellitus without complication, without long-term current use of insulin ([place])     Plan:   Return in about 2 months (around 09/11/2018) for diabetes follow up- A1c.  No orders of the defined types were placed in this encounter.    Requested Prescriptions    No prescriptions requested or ordered in this encounter

## 2018-07-13 NOTE — Assessment & Plan Note (Signed)
Per home readings, it appears his DM is stable with no further episodes of hypoglycemia Continue current medications RTC in about 2 months for follow up, update A1c  Additional education on AVS

## 2018-07-13 NOTE — Patient Instructions (Signed)
Diabetes Mellitus and Nutrition, Adult  When you have diabetes (diabetes mellitus), it is very important to have healthy eating habits because your blood sugar (glucose) levels are greatly affected by what you eat and drink. Eating healthy foods in the appropriate amounts, at about the same times every day, can help you:  · Control your blood glucose.  · Lower your risk of heart disease.  · Improve your blood pressure.  · Reach or maintain a healthy weight.  Every person with diabetes is different, and each person has different needs for a meal plan. Your health care provider may recommend that you work with a diet and nutrition specialist (dietitian) to make a meal plan that is best for you. Your meal plan may vary depending on factors such as:  · The calories you need.  · The medicines you take.  · Your weight.  · Your blood glucose, blood pressure, and cholesterol levels.  · Your activity level.  · Other health conditions you have, such as heart or kidney disease.  How do carbohydrates affect me?  Carbohydrates, also called carbs, affect your blood glucose level more than any other type of food. Eating carbs naturally raises the amount of glucose in your blood. Carb counting is a method for keeping track of how many carbs you eat. Counting carbs is important to keep your blood glucose at a healthy level, especially if you use insulin or take certain oral diabetes medicines.  It is important to know how many carbs you can safely have in each meal. This is different for every person. Your dietitian can help you calculate how many carbs you should have at each meal and for each snack.  Foods that contain carbs include:  · Bread, cereal, rice, pasta, and crackers.  · Potatoes and corn.  · Peas, beans, and lentils.  · Milk and yogurt.  · Fruit and juice.  · Desserts, such as cakes, cookies, ice cream, and candy.  How does alcohol affect me?  Alcohol can cause a sudden decrease in blood glucose (hypoglycemia),  especially if you use insulin or take certain oral diabetes medicines. Hypoglycemia can be a life-threatening condition. Symptoms of hypoglycemia (sleepiness, dizziness, and confusion) are similar to symptoms of having too much alcohol.  If your health care provider says that alcohol is safe for you, follow these guidelines:  · Limit alcohol intake to no more than 1 drink per day for nonpregnant women and 2 drinks per day for men. One drink equals 12 oz of beer, 5 oz of wine, or 1½ oz of hard liquor.  · Do not drink on an empty stomach.  · Keep yourself hydrated with water, diet soda, or unsweetened iced tea.  · Keep in mind that regular soda, juice, and other mixers may contain a lot of sugar and must be counted as carbs.  What are tips for following this plan?    Reading food labels  · Start by checking the serving size on the "Nutrition Facts" label of packaged foods and drinks. The amount of calories, carbs, fats, and other nutrients listed on the label is based on one serving of the item. Many items contain more than one serving per package.  · Check the total grams (g) of carbs in one serving. You can calculate the number of servings of carbs in one serving by dividing the total carbs by 15. For example, if a food has 30 g of total carbs, it would be equal to 2   servings of carbs.  · Check the number of grams (g) of saturated and trans fats in one serving. Choose foods that have low or no amount of these fats.  · Check the number of milligrams (mg) of salt (sodium) in one serving. Most people should limit total sodium intake to less than 2,300 mg per day.  · Always check the nutrition information of foods labeled as "low-fat" or "nonfat". These foods may be higher in added sugar or refined carbs and should be avoided.  · Talk to your dietitian to identify your daily goals for nutrients listed on the label.  Shopping  · Avoid buying canned, premade, or processed foods. These foods tend to be high in fat, sodium,  and added sugar.  · Shop around the outside edge of the grocery store. This includes fresh fruits and vegetables, bulk grains, fresh meats, and fresh dairy.  Cooking  · Use low-heat cooking methods, such as baking, instead of high-heat cooking methods like deep frying.  · Cook using healthy oils, such as olive, canola, or sunflower oil.  · Avoid cooking with butter, cream, or high-fat meats.  Meal planning  · Eat meals and snacks regularly, preferably at the same times every day. Avoid going long periods of time without eating.  · Eat foods high in fiber, such as fresh fruits, vegetables, beans, and whole grains. Talk to your dietitian about how many servings of carbs you can eat at each meal.  · Eat 4-6 ounces (oz) of lean protein each day, such as lean meat, chicken, fish, eggs, or tofu. One oz of lean protein is equal to:  ? 1 oz of meat, chicken, or fish.  ? 1 egg.  ? ¼ cup of tofu.  · Eat some foods each day that contain healthy fats, such as avocado, nuts, seeds, and fish.  Lifestyle  · Check your blood glucose regularly.  · Exercise regularly as told by your health care provider. This may include:  ? 150 minutes of moderate-intensity or vigorous-intensity exercise each week. This could be brisk walking, biking, or water aerobics.  ? Stretching and doing strength exercises, such as yoga or weightlifting, at least 2 times a week.  · Take medicines as told by your health care provider.  · Do not use any products that contain nicotine or tobacco, such as cigarettes and e-cigarettes. If you need help quitting, ask your health care provider.  · Work with a counselor or diabetes educator to identify strategies to manage stress and any emotional and social challenges.  Questions to ask a health care provider  · Do I need to meet with a diabetes educator?  · Do I need to meet with a dietitian?  · What number can I call if I have questions?  · When are the best times to check my blood glucose?  Where to find more  information:  · American Diabetes Association: diabetes.org  · Academy of Nutrition and Dietetics: www.eatright.org  · National Institute of Diabetes and Digestive and Kidney Diseases (NIH): www.niddk.nih.gov  Summary  · A healthy meal plan will help you control your blood glucose and maintain a healthy lifestyle.  · Working with a diet and nutrition specialist (dietitian) can help you make a meal plan that is best for you.  · Keep in mind that carbohydrates (carbs) and alcohol have immediate effects on your blood glucose levels. It is important to count carbs and to use alcohol carefully.  This information is not intended to   replace advice given to you by your health care provider. Make sure you discuss any questions you have with your health care provider.  Document Released: 01/29/2005 Document Revised: 12/02/2016 Document Reviewed: 06/08/2016  Elsevier Interactive Patient Education © 2019 Elsevier Inc.

## 2018-08-25 ENCOUNTER — Other Ambulatory Visit: Payer: Self-pay | Admitting: *Deleted

## 2018-08-25 MED ORDER — GLUCOSE BLOOD VI STRP: ORAL_STRIP | 3 refills | Status: DC

## 2018-08-29 DIAGNOSIS — R69 Illness, unspecified: Secondary | ICD-10-CM | POA: Diagnosis not present

## 2018-09-12 ENCOUNTER — Ambulatory Visit: Payer: Medicare HMO | Admitting: Family

## 2018-09-12 ENCOUNTER — Ambulatory Visit: Payer: Medicare HMO | Admitting: Nurse Practitioner

## 2018-09-12 ENCOUNTER — Other Ambulatory Visit: Payer: Self-pay | Admitting: *Deleted

## 2018-09-12 DIAGNOSIS — E119 Type 2 diabetes mellitus without complications: Secondary | ICD-10-CM

## 2018-09-12 MED ORDER — GLIPIZIDE 5 MG PO TABS: ORAL_TABLET | ORAL | 0 refills | Status: DC

## 2018-09-20 ENCOUNTER — Other Ambulatory Visit: Payer: Self-pay | Admitting: *Deleted

## 2018-09-20 IMAGING — CT CT CHEST LUNG CANCER SCREENING LOW DOSE W/O CM
2 of 3 series · 15 of 36 positions shown, 18 images · non-contrast
Comparison: None.

CLINICAL DATA: 62-year-old male current smoker with 86 pack-year
history of smoking. Lung cancer screening examination.

EXAM:
CT CHEST WITHOUT CONTRAST LOW-DOSE FOR LUNG CANCER SCREENING
TECHNIQUE: Multidetector CT imaging of the chest was performed following the
standard protocol without IV contrast.

[Series 2: thorax 5.0 i31f 3 · axial · 0.72mm/px · z∈[+1188,+1443]mm · 12 of 61 slices shown, 15 images]
[im 5/61  mediastinal]
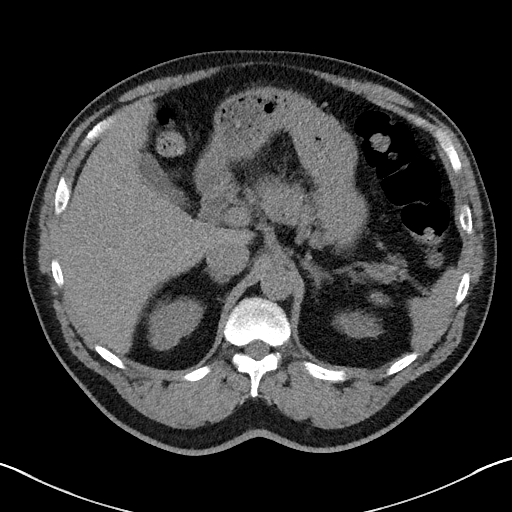
[im 5/61  lung]
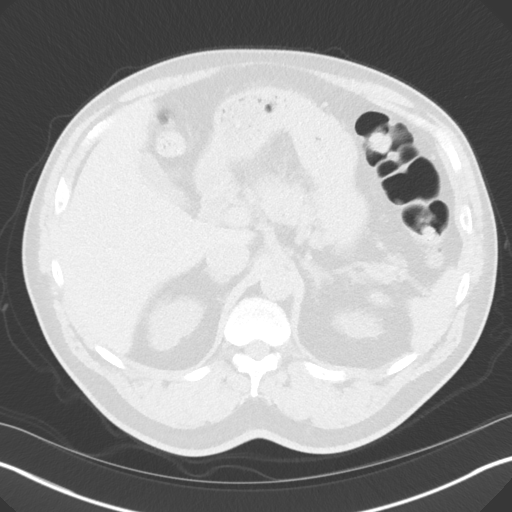
[im 9/61  lung]
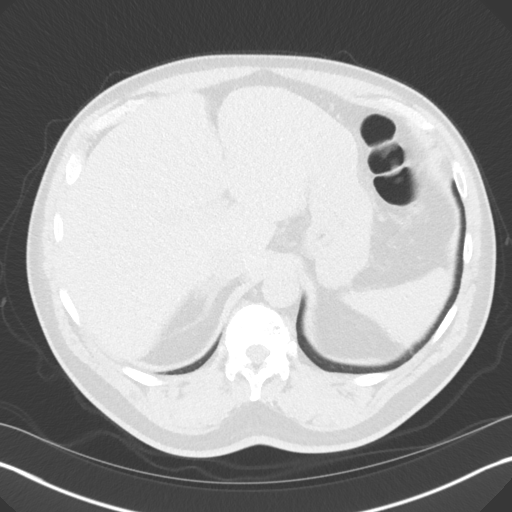
[im 14/61  lung]
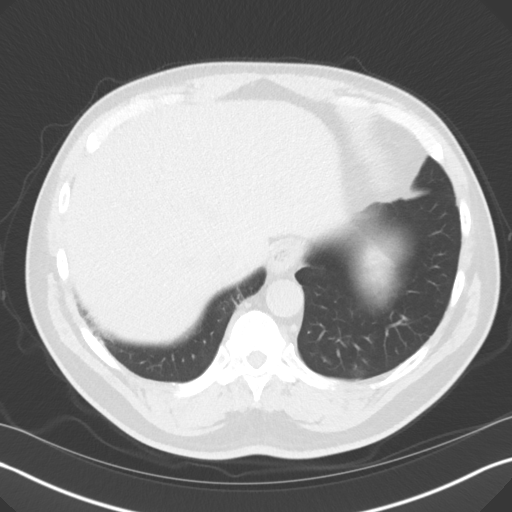
[im 18/61  lung]
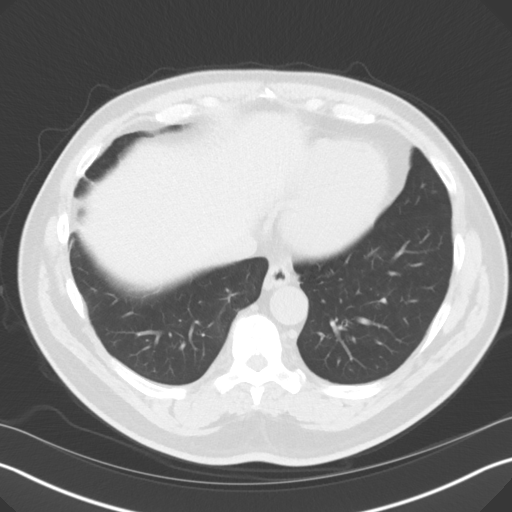
[im 23/61  mediastinal]
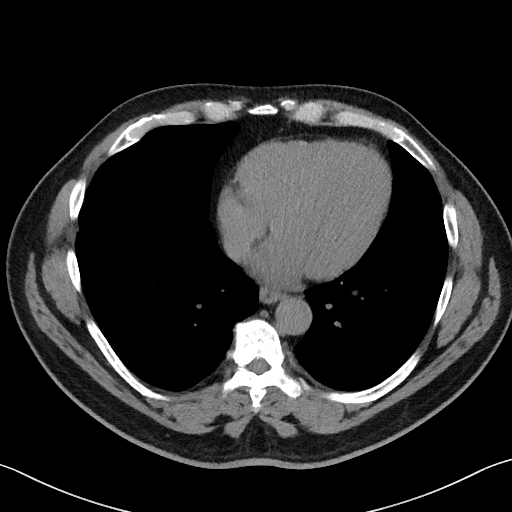
[im 23/61  lung]
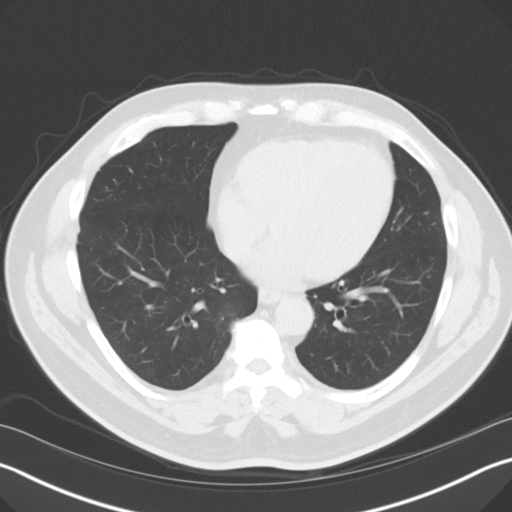
[im 27/61  lung]
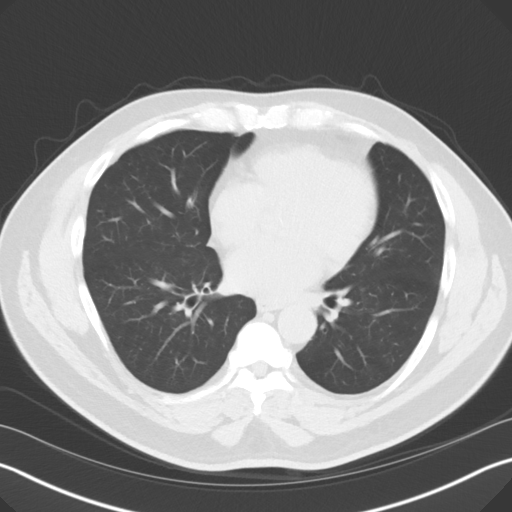
[im 34/61  lung]
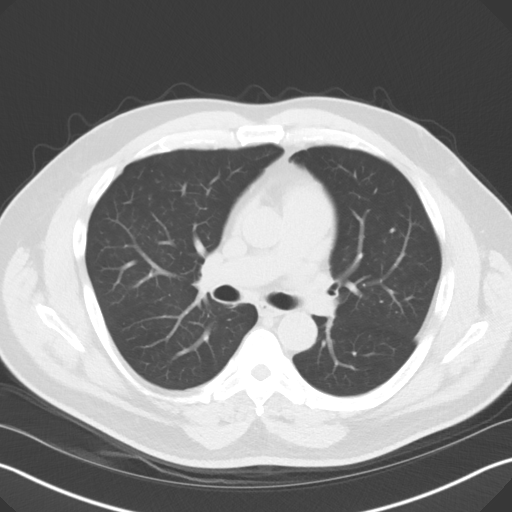
[im 38/61  lung]
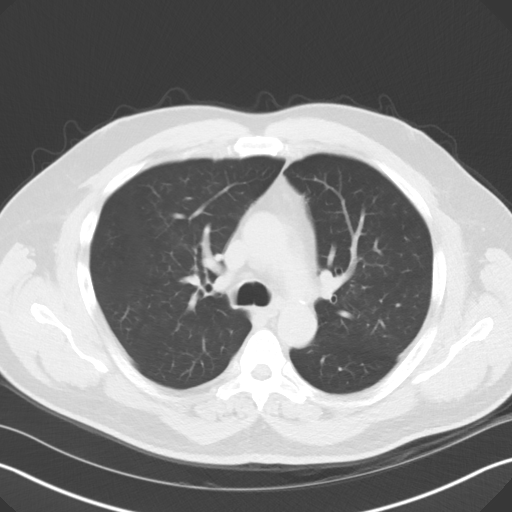
[im 43/61  mediastinal]
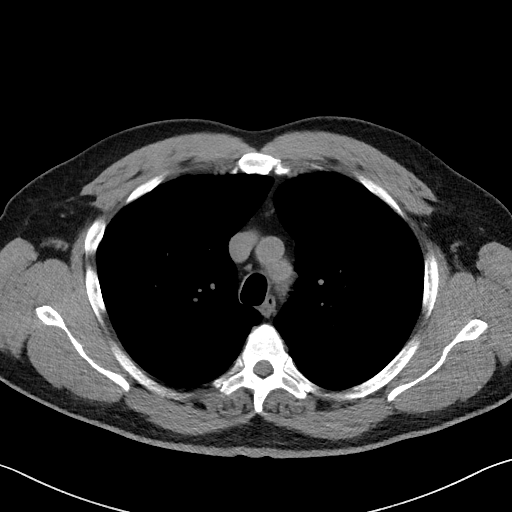
[im 43/61  lung]
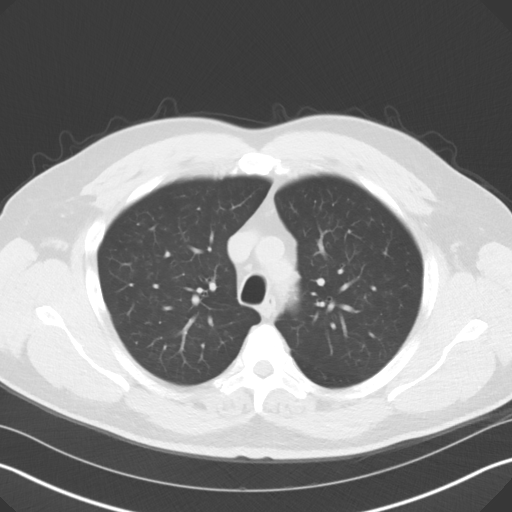
[im 47/61  lung]
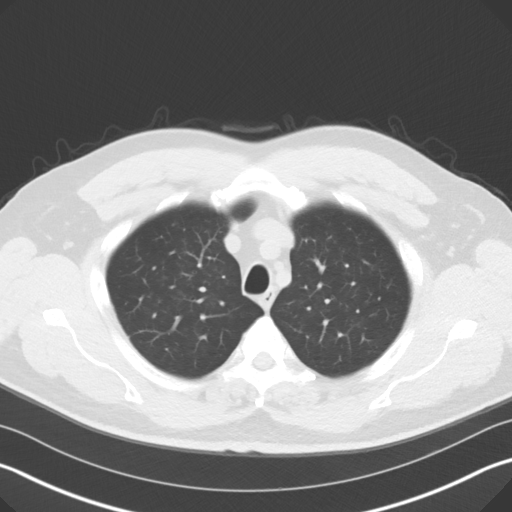
[im 52/61  lung]
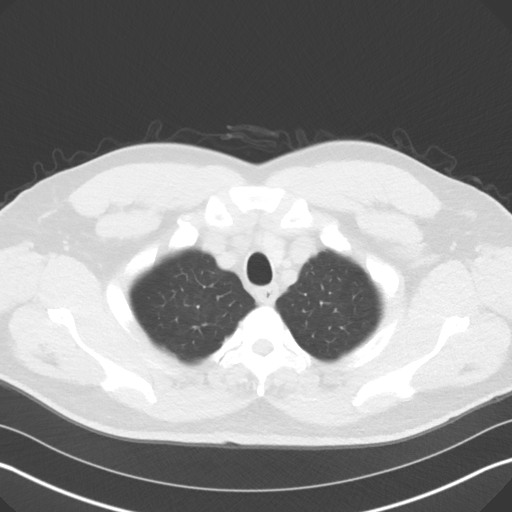
[im 56/61  lung]
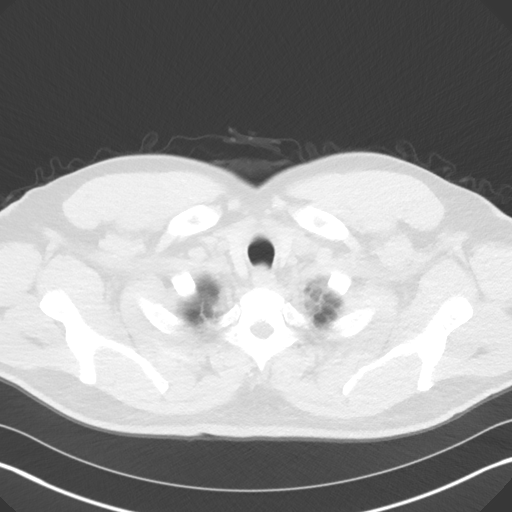

[Series 5: coronal · coronal · 0.62mm/px · 3 of 135 slices shown]
[im 27/135  lung]
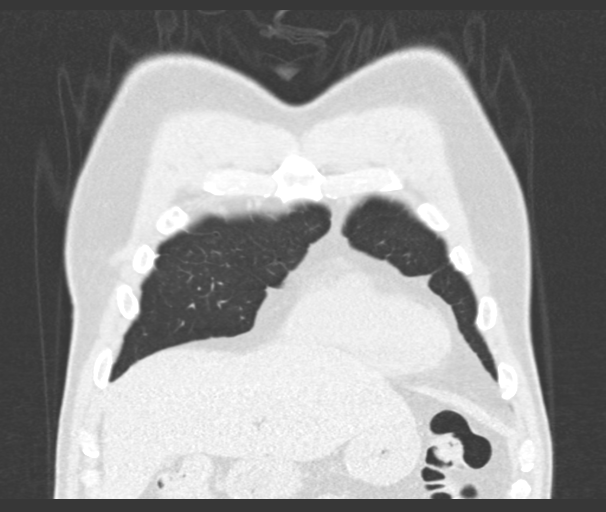
[im 54/135  lung]
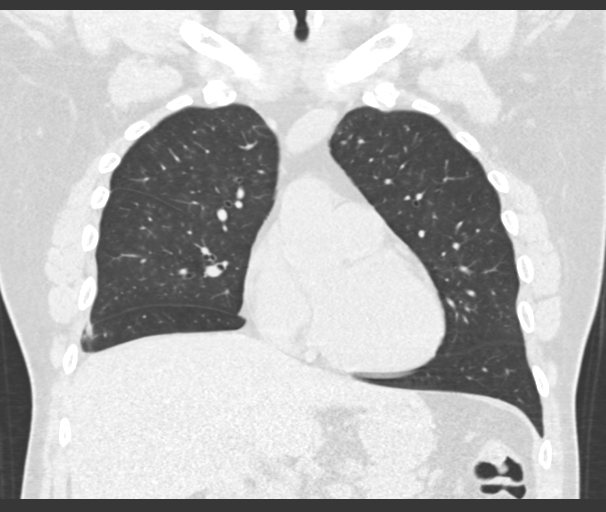
[im 81/135  lung]
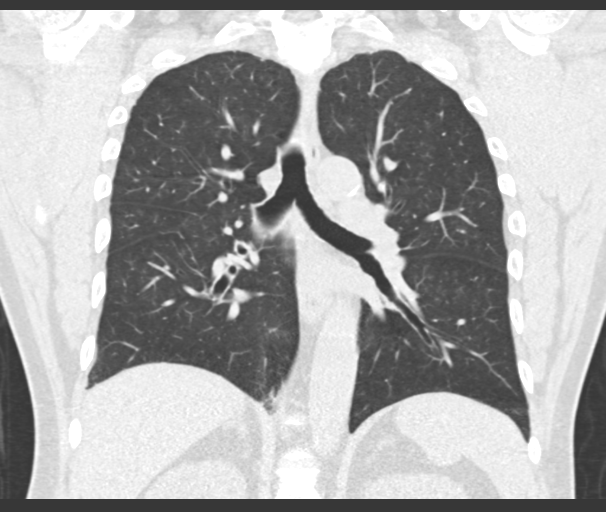

[15 of 36 positions shown; findings below may reference images not displayed]

FINDINGS: Cardiovascular: Heart size is normal. There is no significant
pericardial fluid, thickening or pericardial calcification. There is
aortic atherosclerosis, as well as atherosclerosis of the great
vessels of the mediastinum and the coronary arteries, including
calcified atherosclerotic plaque in the left anterior descending and
left circumflex coronary arteries.

Mediastinum/Nodes: No pathologically enlarged mediastinal or hilar
lymph nodes. Please note that accurate exclusion of hilar adenopathy
is limited on noncontrast CT scans. Esophagus is unremarkable in
appearance. No axillary lymphadenopathy.

Lungs/Pleura: A few tiny pulmonary nodules are noted in the lungs,
the largest of which is in the right middle lobe (axial image 152 of
series 3), with a volume derived mean diameter of only 2.9 mm. No
larger more suspicious appearing pulmonary nodules or masses are
noted. No acute consolidative airspace disease. No pleural
effusions. Diffuse bronchial wall thickening with mild centrilobular
and paraseptal emphysema.

Upper Abdomen: Incompletely imaged left adrenal nodule measuring at
least 2 cm is low-attenuation (5 HU), compatible with an adenoma.

Musculoskeletal: There are no aggressive appearing lytic or blastic
lesions noted in the visualized portions of the skeleton.
IMPRESSION: 1. Lung-RADS 2S, benign appearance or behavior. Continue annual
screening with low-dose chest CT without contrast in 12 months.
2. The "S" modifier above refers to potentially clinically
significant non lung cancer related findings. Specifically, there is
aortic atherosclerosis, in addition to 2 vessel coronary artery
disease. Please note that although the presence of coronary artery
calcium documents the presence of coronary artery disease, the
severity of this disease and any potential stenosis cannot be
assessed on this non-gated CT examination. Assessment for potential
risk factor modification, dietary therapy or pharmacologic therapy
may be warranted, if clinically indicated.
3. Mild diffuse bronchial wall thickening with mild centrilobular
and paraseptal emphysema; imaging findings suggestive of underlying
COPD.
4. Small left adrenal adenoma.

Aortic Atherosclerosis (VVEYP-EYL.L) and Emphysema (VVEYP-FKM.8).

## 2018-09-20 MED ORDER — ROSUVASTATIN CALCIUM 20 MG PO TABS: 20.0000 mg | ORAL_TABLET | Freq: Every day | ORAL | 0 refills | Status: DC

## 2018-09-21 ENCOUNTER — Telehealth: Payer: Self-pay | Admitting: Nurse Practitioner

## 2018-09-21 NOTE — Telephone Encounter (Signed)
Copied from [place] [id number]. Topic: Quick Communication - Rx Refill/Question >> Sep 21, 2018  4:48 PM Selinda Flavin B, NT wrote: Medication: tamsulosin (FLOMAX) 0.4 MG CAPS capsule  Has the patient contacted their pharmacy? yes (Agent: If no, request that the patient contact the pharmacy for the refill.) (Agent: If yes, when and what did the pharmacy advise?)  Preferred Pharmacy (with phone number or street name): [facility] [place], [place] - [address]  Agent: Please be advised that RX refills may take up to 3 business days. We ask that you follow-up with your pharmacy.

## 2018-09-22 MED ORDER — TAMSULOSIN HCL 0.4 MG PO CAPS: 0.4000 mg | ORAL_CAPSULE | Freq: Every day | ORAL | 1 refills | Status: DC

## 2018-09-22 NOTE — Telephone Encounter (Signed)
Rx sent. See meds.  

## 2018-10-10 DIAGNOSIS — R69 Illness, unspecified: Secondary | ICD-10-CM | POA: Diagnosis not present

## 2018-10-11 DIAGNOSIS — H2513 Age-related nuclear cataract, bilateral: Secondary | ICD-10-CM | POA: Diagnosis not present

## 2018-10-11 DIAGNOSIS — E119 Type 2 diabetes mellitus without complications: Secondary | ICD-10-CM | POA: Diagnosis not present

## 2018-10-11 DIAGNOSIS — H40013 Open angle with borderline findings, low risk, bilateral: Secondary | ICD-10-CM | POA: Diagnosis not present

## 2018-10-19 ENCOUNTER — Other Ambulatory Visit: Payer: Self-pay | Admitting: *Deleted

## 2018-10-19 DIAGNOSIS — I10 Essential (primary) hypertension: Secondary | ICD-10-CM

## 2018-10-19 MED ORDER — AMLODIPINE BESYLATE 10 MG PO TABS: 10.0000 mg | ORAL_TABLET | Freq: Every day | ORAL | 0 refills | Status: DC

## 2018-11-21 ENCOUNTER — Other Ambulatory Visit: Payer: Self-pay | Admitting: Internal Medicine

## 2018-11-21 ENCOUNTER — Ambulatory Visit: Payer: Medicare HMO | Admitting: Family

## 2018-11-29 DIAGNOSIS — R69 Illness, unspecified: Secondary | ICD-10-CM | POA: Diagnosis not present

## 2018-12-05 ENCOUNTER — Ambulatory Visit (INDEPENDENT_AMBULATORY_CARE_PROVIDER_SITE_OTHER): Payer: Medicare HMO | Admitting: Family

## 2018-12-05 ENCOUNTER — Other Ambulatory Visit (INDEPENDENT_AMBULATORY_CARE_PROVIDER_SITE_OTHER): Payer: Medicare HMO

## 2018-12-05 ENCOUNTER — Other Ambulatory Visit: Payer: Self-pay

## 2018-12-05 ENCOUNTER — Encounter: Payer: Self-pay | Admitting: Gastroenterology

## 2018-12-05 ENCOUNTER — Other Ambulatory Visit: Payer: Self-pay | Admitting: Family

## 2018-12-05 ENCOUNTER — Encounter: Payer: Self-pay | Admitting: Family

## 2018-12-05 VITALS — BP 140/86 | HR 71 | Temp 98.3°F | Ht 68.0 in | Wt 204.6 lb

## 2018-12-05 DIAGNOSIS — E119 Type 2 diabetes mellitus without complications: Secondary | ICD-10-CM

## 2018-12-05 DIAGNOSIS — I10 Essential (primary) hypertension: Secondary | ICD-10-CM

## 2018-12-05 DIAGNOSIS — Z1211 Encounter for screening for malignant neoplasm of colon: Secondary | ICD-10-CM

## 2018-12-05 DIAGNOSIS — Z125 Encounter for screening for malignant neoplasm of prostate: Secondary | ICD-10-CM

## 2018-12-05 DIAGNOSIS — R972 Elevated prostate specific antigen [PSA]: Secondary | ICD-10-CM

## 2018-12-05 LAB — LIPID PANEL
Cholesterol: 154 mg/dL (ref 0–200)
HDL: 50.5 mg/dL (ref >39.00)
LDL Cholesterol: 71 mg/dL (ref 0–99)
NonHDL: 103.1
Total CHOL/HDL Ratio: 3
Triglycerides: 163 mg/dL — ABNORMAL HIGH (ref 0.0–149.0)
VLDL: 32.6 mg/dL (ref 0.0–40.0)

## 2018-12-05 LAB — CBC WITH DIFFERENTIAL/PLATELET
Basophils Absolute: 0 10*3/uL (ref 0.0–0.1)
Basophils Relative: 0.8 % (ref 0.0–3.0)
Eosinophils Absolute: 0.1 10*3/uL (ref 0.0–0.7)
Eosinophils Relative: 2.6 % (ref 0.0–5.0)
HCT: 42.8 % (ref 39.0–52.0)
Hemoglobin: 14.3 g/dL (ref 13.0–17.0)
Lymphocytes Relative: 29.3 % (ref 12.0–46.0)
Lymphs Abs: 1.4 10*3/uL (ref 0.7–4.0)
MCHC: 33.5 g/dL (ref 30.0–36.0)
MCV: 89.2 fl (ref 78.0–100.0)
Monocytes Absolute: 0.4 10*3/uL (ref 0.1–1.0)
Monocytes Relative: 8 % (ref 3.0–12.0)
Neutro Abs: 2.9 10*3/uL (ref 1.4–7.7)
Neutrophils Relative %: 59.3 % (ref 43.0–77.0)
Platelets: 253 10*3/uL (ref 150.0–400.0)
RBC: 4.8 Mil/uL (ref 4.22–5.81)
RDW: 14 % (ref 11.5–15.5)
WBC: 4.8 10*3/uL (ref 4.0–10.5)

## 2018-12-05 LAB — COMPREHENSIVE METABOLIC PANEL
ALT: 13 U/L (ref 0–53)
AST: 14 U/L (ref 0–37)
Albumin: 4.4 g/dL (ref 3.5–5.2)
Alkaline Phosphatase: 62 U/L (ref 39–117)
BUN: 10 mg/dL (ref 6–23)
CO2: 28 mEq/L (ref 19–32)
Calcium: 9.3 mg/dL (ref 8.4–10.5)
Chloride: 104 mEq/L (ref 96–112)
Creatinine, Ser: 1.03 mg/dL (ref 0.40–1.50)
GFR: 88.4 mL/min (ref >60.00)
Glucose, Bld: 118 mg/dL — ABNORMAL HIGH (ref 70–99)
Potassium: 4.3 mEq/L (ref 3.5–5.1)
Sodium: 141 mEq/L (ref 135–145)
Total Bilirubin: 0.3 mg/dL (ref 0.2–1.2)
Total Protein: 6.7 g/dL (ref 6.0–8.3)

## 2018-12-05 LAB — PSA, MEDICARE: PSA: 4.5 ng/ml — ABNORMAL HIGH (ref 0.10–4.00)

## 2018-12-05 LAB — HEMOGLOBIN A1C: Hgb A1c MFr Bld: 7.2 % — ABNORMAL HIGH (ref 4.6–6.5)

## 2018-12-05 MED ORDER — GLIPIZIDE 5 MG PO TABS: ORAL_TABLET | ORAL | 1 refills | Status: DC

## 2018-12-05 MED ORDER — ROSUVASTATIN CALCIUM 20 MG PO TABS: 20.0000 mg | ORAL_TABLET | Freq: Every day | ORAL | 3 refills | Status: DC

## 2018-12-05 MED ORDER — AMLODIPINE BESYLATE 10 MG PO TABS: 10.0000 mg | ORAL_TABLET | Freq: Every day | ORAL | 1 refills | Status: DC

## 2018-12-05 NOTE — Progress Notes (Signed)
Benjamin Serrano is a 63 y.o. male with the following history as recorded in Epic[facility]:  Patient Active Problem List   Diagnosis Date Noted  . Routine general medical examination at a health care facility 11/29/2017  . Erectile dysfunction 05/29/2017  . Type 2 diabetes mellitus ([place]) 09/23/2016  . Hyperlipidemia 09/23/2016  . Essential hypertension 09/23/2016    Current Outpatient Medications  Medication Sig Dispense Refill  . amLODipine (NORVASC) 10 MG tablet Take 1 tablet (10 mg total) by mouth daily. 90 tablet 1  . ammonium lactate (LAC-HYDRIN) 12 % cream Apply topically as needed for dry skin. 385 g 0  . Blood Glucose Monitoring Suppl (ONE TOUCH ULTRA 2) w/Device KIT Use to check blood sugars twice a day 1 each 0  . fish oil-omega-3 fatty acids 1000 MG capsule Take 1 g by mouth 2 (two) times daily.    Marland Kitchen glipiZIDE (GLUCOTROL) 5 MG tablet TAKE 1 TABLET BY MOUTH  DAILY . 90 tablet 1  . glucose blood (ONE TOUCH ULTRA TEST) test strip USE AS DIRECTED TWICE DAILY 100 each 3  . metFORMIN (GLUCOPHAGE) 1000 MG tablet Take 1 tablet (1,000 mg total) by mouth 2 (two) times daily with a meal. 180 tablet 2  . ONETOUCH DELICA LANCETS 86P MISC Use to check blood sugars 2 times daily 100 each 5  . rosuvastatin (CRESTOR) 20 MG tablet Take 1 tablet (20 mg total) by mouth daily. 90 tablet 3  . tamsulosin (FLOMAX) 0.4 MG CAPS capsule Take 1 capsule by mouth once daily 90 capsule 3   No current facility-administered medications for this visit.     Allergies: Patient has no known allergies.  Past Medical History:  Diagnosis Date  . Asthma   . Depression   . Diabetes mellitus without complication ([place])   . Hyperlipemia   . Hypertension     Past Surgical History:  Procedure Laterality Date  . Arm surgery Right   . LEG SURGERY      Family History  Problem Relation Age of Onset  . Diabetes Mother   . Alzheimer's disease Maternal Grandmother   . Hypertension Maternal Grandmother     Social  History   Tobacco Use  . Smoking status: Current Every Day Smoker    Packs/day: 2.00    Years: 43.00    Pack years: 86.00    Types: Cigarettes  . Smokeless tobacco: Never Used  Substance Use Topics  . Alcohol use: No    Comment: social    Subjective:  Patient presents for a diabetes follow-up; last Hgba1c was 7.0 in early January 2020; taking Metformin and Glucotrol 5 mg daily; denies any concerns for low blood sugar; Denies any chest pain, shortness of breath, blurred vision or headache. Agrees to get baseline colonoscopy scheduled;     Objective:  Vitals:   12/05/18 0904  BP: 140/86  Pulse: 71  Temp: 98.3 F (36.8 C)  TempSrc: Oral  SpO2: 97%  Weight: 204 lb 9.6 oz (92.8 kg)  Height: '5\' 8"'  (1.727 m)    General: Well developed, well nourished, in no acute distress  Skin : Warm and dry.  Head: Normocephalic and atraumatic  Lungs: Respirations unlabored; clear to auscultation bilaterally without wheeze, rales, rhonchi  CVS exam: normal rate and regular rhythmnormal rate and regular rhythm.  Vessels: Symmetric bilaterally  Neurologic: Alert and oriented; speech intact; face symmetrical; moves all extremities well; CNII-XII intact without focal deficit   Assessment:  1. Type 2 diabetes mellitus without complication,  without long-term current use of insulin ([place])   2. Prostate cancer screening   3. Essential hypertension   4. Encounter for screening colonoscopy     Plan:  Labs and refills updated; plan to follow-up in 6 months, sooner prn. Order for screening colonoscopy updated;   No follow-ups on file.  Orders Placed This Encounter  Procedures  . CBC w/Diff    Standing Status:   Future    Standing Expiration Date:   12/05/2019  . Comp Met (CMET)    Standing Status:   Future    Standing Expiration Date:   12/05/2019  . Lipid panel    Standing Status:   Future    Standing Expiration Date:   12/05/2019  . HgB A1c    Standing Status:   Future    Standing  Expiration Date:   12/05/2019  . PSA, Medicare    Standing Status:   Future    Standing Expiration Date:   12/05/2019  . Ambulatory referral to Gastroenterology    Referral Priority:   Routine    Referral Type:   Consultation    Referral Reason:   Specialty Services Required    Number of Visits Requested:   1    Requested Prescriptions   Signed Prescriptions Disp Refills  . glipiZIDE (GLUCOTROL) 5 MG tablet 90 tablet 1    Sig: TAKE 1 TABLET BY MOUTH  DAILY .  Marland Kitchen amLODipine (NORVASC) 10 MG tablet 90 tablet 1    Sig: Take 1 tablet (10 mg total) by mouth daily.  . rosuvastatin (CRESTOR) 20 MG tablet 90 tablet 3    Sig: Take 1 tablet (20 mg total) by mouth daily.

## 2018-12-13 ENCOUNTER — Ambulatory Visit (INDEPENDENT_AMBULATORY_CARE_PROVIDER_SITE_OTHER): Payer: Medicare HMO | Admitting: *Deleted

## 2018-12-13 DIAGNOSIS — Z0000 Encounter for general adult medical examination without abnormal findings: Secondary | ICD-10-CM | POA: Diagnosis not present

## 2018-12-13 NOTE — Progress Notes (Addendum)
Subjective:   Benjamin Serrano is a 63 y.o. male who presents for Medicare Annual/Subsequent preventive examination. I connected with patient by a telephone and verified that I am speaking with the correct person using two identifiers. Patient stated full name and DOB. Patient gave permission to continue with telephonic visit. Patient's location was at home and Nurse's location was at [place] office.   Review of Systems:   Cardiac Risk Factors include: advanced age (>59mn, >>26women);diabetes mellitus;dyslipidemia;hypertension;male gender Sleep patterns: has frequent nighttime awakenings, gets up 2 times nightly to void and sleeps 5-6 hours nightly.    Home Safety/Smoke Alarms: Feels safe in home. Smoke alarms in place.  Living environment; residence and Firearm Safety: 1-story house/ trailer. Lives with mother, no needs for DME, good support system Seat Belt Safety/Bike Helmet: Wears seat belt.   PSA-  Lab Results  Component Value Date   PSA 4.50 (H) 12/05/2018   PSA 2.73 05/28/2017       Objective:    Vitals: There were no vitals taken for this visit.  There is no height or weight on file to calculate BMI.  Advanced Directives 12/13/2018 11/29/2017  Does Patient Have a Medical Advance Directive? No No  Would patient like information on creating a medical advance directive? No - Patient declined Yes (ED - Information included in AVS)    Tobacco Social History   Tobacco Use  Smoking Status Current Every Day Smoker  . Packs/day: 2.00  . Years: 43.00  . Pack years: 86.00  . Types: Cigarettes  Smokeless Tobacco Never Used     Ready to quit: No Counseling given: No  Past Medical History:  Diagnosis Date  . Asthma   . Depression   . [place]   . Hyperlipemia   . Hypertension    Past Surgical History:  Procedure Laterality Date  . Arm surgery Right   . LEG SURGERY     Family History  Problem Relation Age of Onset  . Diabetes  Mother   . Alzheimer's disease Maternal Grandmother   . Hypertension Maternal Grandmother    Social History   Socioeconomic History  . Marital status: Widowed    Spouse name: Not on file  . Number of children: 1  . Years of education: 946 . Highest education level: Not on file  Occupational History  . Occupation: Disability    Comment: Mental health  Social Needs  . Financial resource strain: Not hard at all  . Food insecurity    Worry: Never true    Inability: Never true  . Transportation needs    Medical: No    Non-medical: No  Tobacco Use  . Smoking status: Current Every Day Smoker    Packs/day: 2.00    Years: 43.00    Pack years: 86.00    Types: Cigarettes  . Smokeless tobacco: Never Used  Substance and Sexual Activity  . Alcohol use: Yes    Comment: social  . Drug use: No  . Sexual activity: Not Currently  Lifestyle  . Physical activity    Days per week: 4 days    Minutes per session: 50 min  . Stress: Only a little  Relationships  . Social connections    Talks on phone: More than three times a week    Gets together: More than three times a week    Attends religious service: Not on file    Active member of club or organization: Not on file  Attends meetings of clubs or organizations: Not on file    Relationship status: Widowed  Other Topics Concern  . Not on file  Social History Narrative   Fun/Hobby: Oceanographer.     Outpatient Encounter Medications as of 12/13/2018  Medication Sig  . amLODipine (NORVASC) 10 MG tablet Take 1 tablet (10 mg total) by mouth daily.  Marland Kitchen ammonium lactate (LAC-HYDRIN) 12 % cream Apply topically as needed for dry skin.  . Blood Glucose Monitoring Suppl (ONE TOUCH ULTRA 2) w/Device KIT Use to check blood sugars twice a day  . fish oil-omega-3 fatty acids 1000 MG capsule Take 1 g by mouth 2 (two) times daily.  Marland Kitchen glipiZIDE (GLUCOTROL) 5 MG tablet TAKE 1 TABLET BY MOUTH  DAILY .  . glucose blood (ONE TOUCH ULTRA TEST) test  strip USE AS DIRECTED TWICE DAILY  . metFORMIN (GLUCOPHAGE) 1000 MG tablet Take 1 tablet (1,000 mg total) by mouth 2 (two) times daily with a meal.  . ONETOUCH DELICA LANCETS 00T MISC Use to check blood sugars 2 times daily  . rosuvastatin (CRESTOR) 20 MG tablet Take 1 tablet (20 mg total) by mouth daily.  . tamsulosin (FLOMAX) 0.4 MG CAPS capsule Take 1 capsule by mouth once daily   No facility-administered encounter medications on file as of 12/13/2018.     Activities of Daily Living In your present state of health, do you have any difficulty performing the following activities: 12/13/2018  Hearing? N  Vision? N  Difficulty concentrating or making decisions? N  Walking or climbing stairs? N  Dressing or bathing? N  Doing errands, shopping? N  Preparing Food and eating ? N  Using the Toilet? N  In the past six months, have you accidently leaked urine? N  Do you have problems with loss of bowel control? N  Managing your Medications? N  Managing your Finances? N  Housekeeping or managing your Housekeeping? N  Some recent data might be hidden    Patient Care Team: Lance Sell, NP as PCP - General (Nurse Practitioner) Warden Fillers, MD as Consulting Physician (Ophthalmology) Gardiner Barefoot, DPM as Consulting Physician (Podiatry)   Assessment:   This is a routine wellness examination for Benjamin Serrano. Physical assessment deferred to PCP.   Exercise Activities and Dietary recommendations Current Exercise Habits: Home exercise routine, Type of exercise: walking(maintains large yard), Time (Minutes): 30, Frequency (Times/Week): 4, Weekly Exercise (Minutes/Week): 120, Exercise limited by: orthopedic condition(s)  Diet (meal preparation, eat out, water intake, caffeinated beverages, dairy products, fruits and vegetables): in general, a "healthy" diet  , on average, 2 meals per day   Reviewed heart healthy and diabetic diet. Encouraged patient to increase daily water and healthy  fluid intake.  Discussed supplementing with Glucerna and coupons provided.   Goals    . Patient Stated     Watch how many carbohydrates I eat daily. Drink glucerna and eat more routinely.       Fall Risk Fall Risk  12/13/2018 11/29/2017  Falls in the past year? 0 No  Number falls in past yr: 0 -    Depression Screen PHQ 2/9 Scores 12/13/2018 11/29/2017  PHQ - 2 Score 2 2  PHQ- 9 Score 3 4    Cognitive Function MMSE - Mini Mental State Exam 12/13/2018 11/29/2017  Not completed: Unable to complete Refused        Immunization History  Administered Date(s) Administered  . Influenza,inj,Quad PF,6+ Mos 05/30/2018  . Pneumococcal Polysaccharide-23 11/29/2017  . Tdap  11/29/2017   Screening Tests Health Maintenance  Topic Date Due  . COLONOSCOPY  06/11/2006  . FOOT EXAM  08/31/2018  . URINE MICROALBUMIN  11/30/2018  . INFLUENZA VACCINE  12/17/2018  . HEMOGLOBIN A1C  06/07/2019  . OPHTHALMOLOGY EXAM  09/30/2019  . TETANUS/TDAP  11/30/2027  . PNEUMOCOCCAL POLYSACCHARIDE VACCINE AGE 39-64 HIGH RISK  Completed  . Hepatitis C Screening  Completed  . HIV Screening  Completed       Plan:    Reviewed health maintenance screenings with patient today and relevant education, vaccines, and/or referrals were provided.   Continue to eat heart healthy diet (full of fruits, vegetables, whole grains, lean protein, water--limit salt, fat, and sugar intake) and increase physical activity as tolerated.  Continue doing brain stimulating activities (puzzles, reading, adult coloring books, staying active) to keep memory sharp.   I have personally reviewed and noted the following in the patient's chart:   . Medical and social history . Use of alcohol, tobacco or illicit drugs  . Current medications and supplements . Functional ability and status . Nutritional status . Physical activity . Advanced directives . List of other physicians . Screenings to include cognitive, depression, and  falls . Referrals and appointments  In addition, I have reviewed and discussed with patient certain preventive protocols, quality metrics, and best practice recommendations. A written personalized care plan for preventive services as well as general preventive health recommendations were provided to patient.     Michiel Cowboy, RN  12/13/2018  Medical screening examination/treatment/procedure(s) were performed by non-physician practitioner and as supervising provider I was immediately available for consultation/collaboration.  I agree with above. Marrian Salvage, FNP

## 2018-12-13 NOTE — Patient Instructions (Addendum)
Continue to eat heart healthy diet (full of fruits, vegetables, whole grains, lean protein, water--limit salt, fat, and sugar intake) and increase physical activity as tolerated.   Benjamin Serrano , Thank you for taking time to come for your Medicare Wellness Visit. I appreciate your ongoing commitment to your health goals. Please review the following plan we discussed and let me know if I can assist you in the future.   These are the goals we discussed: Goals    . Patient Stated     Watch how many carbohydrates I eat daily. Drink glucerna and eat more routinely.       This is a list of the screening recommended for you and due dates:  Health Maintenance  Topic Date Due  . Colon Cancer Screening  06/11/2006  . Complete foot exam   08/31/2018  . Urine Protein Check  11/30/2018  . Flu Shot  12/17/2018  . Hemoglobin A1C  06/07/2019  . Eye exam for diabetics  09/30/2019  . Tetanus Vaccine  11/30/2027  . Pneumococcal vaccine  Completed  .  Hepatitis C: One time screening is recommended by Center for Disease Control  (CDC) for  adults born from [birth year] through 1964.   Completed  . HIV Screening  Completed     Health Maintenance, Male Adopting a healthy lifestyle and getting preventive care are important in promoting health and wellness. Ask your health care provider about:  The right schedule for you to have regular tests and exams.  Things you can do on your own to prevent diseases and keep yourself healthy. What should I know about diet, weight, and exercise? Eat a healthy diet   Eat a diet that includes plenty of vegetables, fruits, low-fat dairy products, and lean protein.  Do not eat a lot of foods that are high in solid fats, added sugars, or sodium. Maintain a healthy weight Body mass index (BMI) is a measurement that can be used to identify possible weight problems. It estimates body fat based on height and weight. Your health care provider can help determine your BMI and help  you achieve or maintain a healthy weight. Get regular exercise Get regular exercise. This is one of the most important things you can do for your health. Most adults should:  Exercise for at least 150 minutes each week. The exercise should increase your heart rate and make you sweat (moderate-intensity exercise).  Do strengthening exercises at least twice a week. This is in addition to the moderate-intensity exercise.  Spend less time sitting. Even light physical activity can be beneficial. Watch cholesterol and blood lipids Have your blood tested for lipids and cholesterol at 62 years of age, then have this test every 5 years. You may need to have your cholesterol levels checked more often if:  Your lipid or cholesterol levels are high.  You are older than 62 years of age.  You are at high risk for heart disease. What should I know about cancer screening? Many types of cancers can be detected early and may often be prevented. Depending on your health history and family history, you may need to have cancer screening at various ages. This may include screening for:  Colorectal cancer.  Prostate cancer.  Skin cancer.  Lung cancer. What should I know about heart disease, diabetes, and high blood pressure? Blood pressure and heart disease  High blood pressure causes heart disease and increases the risk of stroke. This is more likely to develop in people who have  high blood pressure readings, are of African descent, or are overweight.  Talk with your health care provider about your target blood pressure readings.  Have your blood pressure checked: ? Every 3-5 years if you are 71-79 years of age. ? Every year if you are 21 years old or older.  If you are between the ages of 55 and 45 and are a current or former smoker, ask your health care provider if you should have a one-time screening for abdominal aortic aneurysm (AAA). Diabetes Have regular diabetes screenings. This checks  your fasting blood sugar level. Have the screening done:  Once every three years after age 29 if you are at a normal weight and have a low risk for diabetes.  More often and at a younger age if you are overweight or have a high risk for diabetes. What should I know about preventing infection? Hepatitis B If you have a higher risk for hepatitis B, you should be screened for this virus. Talk with your health care provider to find out if you are at risk for hepatitis B infection. Hepatitis C Blood testing is recommended for:  Everyone born from [birth year] through 1964.  Anyone with known risk factors for hepatitis C. Sexually transmitted infections (STIs)  You should be screened each year for STIs, including gonorrhea and chlamydia, if: ? You are sexually active and are younger than 62 years of age. ? You are older than 62 years of age and your health care provider tells you that you are at risk for this type of infection. ? Your sexual activity has changed since you were last screened, and you are at increased risk for chlamydia or gonorrhea. Ask your health care provider if you are at risk.  Ask your health care provider about whether you are at high risk for HIV. Your health care provider may recommend a prescription medicine to help prevent HIV infection. If you choose to take medicine to prevent HIV, you should first get tested for HIV. You should then be tested every 3 months for as long as you are taking the medicine. Follow these instructions at home: Lifestyle  Do not use any products that contain nicotine or tobacco, such as cigarettes, e-cigarettes, and chewing tobacco. If you need help quitting, ask your health care provider.  Do not use street drugs.  Do not share needles.  Ask your health care provider for help if you need support or information about quitting drugs. Alcohol use  Do not drink alcohol if your health care provider tells you not to drink.  If you drink  alcohol: ? Limit how much you have to 0-2 drinks a day. ? Be aware of how much alcohol is in your drink. In the [place] one drink equals one 12 oz bottle of beer (355 mL), one 5 oz glass of  (148 mL), or one 1 oz glass of hard liquor (44 mL). General instructions  Schedule regular health, dental, and eye exams.  Stay current with your vaccines.  Tell your health care provider if: ? You often feel depressed. ? You have ever been abused or do not feel safe at home. Summary  Adopting a healthy lifestyle and getting preventive care are important in promoting health and wellness.  Follow your health care provider's instructions about healthy diet, exercising, and getting tested or screened for diseases.  Follow your health care provider's instructions on monitoring your cholesterol and blood pressure. This information is not intended to replace advice given to  you by your health care provider. Make sure you discuss any questions you have with your health care provider. Document Released: 10/31/2007 Document Revised: 04/27/2018 Document Reviewed: 04/27/2018 Elsevier Patient Education  2020 [employer].

## 2018-12-19 ENCOUNTER — Other Ambulatory Visit: Payer: Self-pay | Admitting: Internal Medicine

## 2018-12-20 ENCOUNTER — Telehealth: Payer: Self-pay | Admitting: Family

## 2018-12-20 NOTE — Telephone Encounter (Signed)
Patient called to schedule a 6 month follow up with you (from his last visit on 12/05/2018). Would you be willing to see him until we have a new provider in the office would you you see him to establish care? Please advise.

## 2018-12-21 NOTE — Telephone Encounter (Signed)
Appointment has been scheduled for 6 months.  He is going to establish with the new provide when available.

## 2018-12-21 NOTE — Telephone Encounter (Signed)
I am fine to see him in 6 months and I thought he and I had already discussed that.  Will you clarify if he wants to wait for Ashleigh's replacement? I am trying to make notes in the chart for those patients so I know what the long term plan for them will be. Thanks-

## 2019-01-06 ENCOUNTER — Encounter: Payer: Self-pay | Admitting: Gastroenterology

## 2019-01-06 ENCOUNTER — Ambulatory Visit (INDEPENDENT_AMBULATORY_CARE_PROVIDER_SITE_OTHER): Payer: Medicare HMO | Admitting: Gastroenterology

## 2019-01-06 ENCOUNTER — Encounter (INDEPENDENT_AMBULATORY_CARE_PROVIDER_SITE_OTHER): Payer: Self-pay

## 2019-01-06 VITALS — Temp 97.1°F | Wt 200.0 lb

## 2019-01-06 DIAGNOSIS — Z1211 Encounter for screening for malignant neoplasm of colon: Secondary | ICD-10-CM

## 2019-01-06 DIAGNOSIS — E119 Type 2 diabetes mellitus without complications: Secondary | ICD-10-CM

## 2019-01-06 DIAGNOSIS — R69 Illness, unspecified: Secondary | ICD-10-CM | POA: Diagnosis not present

## 2019-01-06 MED ORDER — NA SULFATE-K SULFATE-MG SULF 17.5-3.13-1.6 GM/177ML PO SOLN: 1.0000 | Freq: Once | ORAL | 0 refills | Status: AC

## 2019-01-06 NOTE — Progress Notes (Signed)
[facility] Consult Note:  History: Benjamin Serrano 01/06/2019  Referring provider: Lance Sell, NP  Reason for consult/chief complaint: discuss colonoscopy   Subjective  HPI:  This is a very pleasant 63 year old man accompanied by his sister, referred to discuss colon cancer screening.  He is generally well, with stable hypertension and diabetes.  He has cognitive impairment, so his sister helps him with finances and medical decisions.  Benjamin Serrano lives with their mother locally and helps care for her.  His sister Benjamin Serrano lives in [place] but she came for the visit today.  He denies chronic abdominal pain, altered bowel habits or rectal bleeding.  His appetite is good and weight stable, and denies dysphagia nausea or vomiting.  He has had no previous colon cancer screening.  July 20 primary care note was reviewed.  ROS:  Review of Systems  Constitutional: Negative for appetite change and unexpected weight change.  HENT: Negative for mouth sores and voice change.   Eyes: Negative for pain and redness.  Respiratory: Negative for cough and shortness of breath.   Cardiovascular: Negative for chest pain and palpitations.  Genitourinary: Negative for dysuria and hematuria.  Musculoskeletal: Negative for arthralgias and myalgias.  Skin: Negative for pallor and rash.  Neurological: Negative for weakness and headaches.  Hematological: Negative for adenopathy.  Psychiatric/Behavioral:       Depression stable     Past Medical History: Past Medical History:  Diagnosis Date  . Asthma   . Depression   . Diabetes mellitus without complication ([place])   . Hyperlipemia   . Hypertension      Past Surgical History: Past Surgical History:  Procedure Laterality Date  . Arm surgery Right   . LEG SURGERY       Family History: Family History  Problem Relation Age of Onset  . Diabetes Mother   . Alzheimer's disease Maternal  Grandmother   . Hypertension Maternal Grandmother   . Colon cancer Neg Hx   . Colon polyps Neg Hx     Social History: Social History   Socioeconomic History  . Marital status: Widowed    Spouse name: Not on file  . Number of children: 1  . Years of education: 89  . Highest education level: Not on file  Occupational History  . Occupation: Disability    Comment: Mental health  Social Needs  . Financial resource strain: Not hard at all  . Food insecurity    Worry: Never true    Inability: Never true  . Transportation needs    Medical: No    Non-medical: No  Tobacco Use  . Smoking status: Current Every Day Smoker    Packs/day: 2.00    Years: 43.00    Pack years: 86.00    Types: Cigarettes  . Smokeless tobacco: Never Used  Substance and Sexual Activity  . Alcohol use: Yes    Comment: social  . Drug use: No  . Sexual activity: Not Currently  Lifestyle  . Physical activity    Days per week: 4 days    Minutes per session: 50 min  . Stress: Only a little  Relationships  . Social connections    Talks on phone: More than three times a week    Gets together: More than three times a week    Attends religious service: Not on file    Active member of club or organization: Not on file    Attends meetings of clubs or organizations: Not  on file    Relationship status: Widowed  Other Topics Concern  . Not on file  Social History Narrative   Fun/Hobby: Oceanographer.     Allergies: No Known Allergies  Outpatient Meds: Current Outpatient Medications  Medication Sig Dispense Refill  . amLODipine (NORVASC) 10 MG tablet Take 1 tablet (10 mg total) by mouth daily. 90 tablet 1  . Blood Glucose Monitoring Suppl (ONE TOUCH ULTRA 2) w/Device KIT Use to check blood sugars twice a day 1 each 0  . glipiZIDE (GLUCOTROL) 5 MG tablet TAKE 1 TABLET BY MOUTH  DAILY . 90 tablet 1  . glucose blood (ONE TOUCH ULTRA TEST) test strip USE AS DIRECTED TWICE DAILY 100 each 3  . metFORMIN  (GLUCOPHAGE) 1000 MG tablet Take 1 tablet (1,000 mg total) by mouth 2 (two) times daily with a meal. 180 tablet 2  . ONETOUCH DELICA LANCETS 69V MISC Use to check blood sugars 2 times daily 100 each 5  . rosuvastatin (CRESTOR) 20 MG tablet Take 1 tablet (20 mg total) by mouth daily. 90 tablet 3  . ammonium lactate (LAC-HYDRIN) 12 % cream Apply topically as needed for dry skin. (Patient not taking: Reported on 01/06/2019) 385 g 0   No current facility-administered medications for this visit.       ___________________________________________________________________ Objective   Exam:  Temp (!) 97.1 F (36.2 C)   Wt 200 lb (90.7 kg)   BMI 30.41 kg/m    General: Well-appearing  Eyes: sclera anicteric, no redness  ENT: oral mucosa moist without lesions, no cervical or supraclavicular lymphadenopathy  CV: RRR without murmur, S1/S2, no JVD, no peripheral edema  Resp: clear to auscultation bilaterally, normal RR and effort noted  GI: soft, overweight, no tenderness, with active bowel sounds. No guarding or palpable organomegaly noted.  Skin; warm and dry, no rash or jaundice noted  Neuro: awake, alert and oriented x 3. Normal gross motor function and fluent speech  Labs:  CBC Latest Ref Rng & Units 12/05/2018 11/29/2017 06/24/2010  WBC 4.0 - 10.5 K/uL 4.8 4.8 5.0  Hemoglobin 13.0 - 17.0 g/dL 14.3 14.1 15.5  Hematocrit 39.0 - 52.0 % 42.8 42.4 46.6  Platelets 150.0 - 400.0 K/uL 253.0 262.0 259   CMP Latest Ref Rng & Units 12/05/2018 05/28/2017 09/23/2016  Glucose 70 - 99 mg/dL 118(H) 100(H) 132(H)  BUN 6 - 23 mg/dL '10 13 13  ' Creatinine 0.40 - 1.50 mg/dL 1.03 0.98 1.13  Sodium 135 - 145 mEq/L 141 140 139  Potassium 3.5 - 5.1 mEq/L 4.3 4.0 4.5  Chloride 96 - 112 mEq/L 104 102 104  CO2 19 - 32 mEq/L '28 30 29  ' Calcium 8.4 - 10.5 mg/dL 9.3 9.5 9.6  Total Protein 6.0 - 8.3 g/dL 6.7 7.2 6.8  Total Bilirubin 0.2 - 1.2 mg/dL 0.3 0.4 0.3  Alkaline Phos 39 - 117 U/L 62 65 58  AST 0 - 37  U/L '14 14 15  ' ALT 0 - 53 U/L '13 13 15   ' Hemoglobin A1c: 7.2  Assessment: Encounter Diagnoses  Name Primary?  . Special screening for malignant neoplasms, colon Yes  . Type 2 diabetes mellitus without complication, without long-term current use of insulin (HCC)     Average risk for colon cancer, I recommended screening colonoscopy.  Procedure was described in detail along with risks and benefits and he is agreeable.  His Sister Dorian Pod is with him today as healthcare power of attorney when needed and to help him manage  medical issues.   The benefits and risks of the planned procedure were described in detail with the patient or (when appropriate) their health care proxy.  Risks were outlined as including, but not limited to, bleeding, infection, perforation, adverse medication reaction leading to cardiac or pulmonary decompensation.  The limitation of incomplete mucosal visualization was also discussed.  No guarantees or warranties were given.  Total time 30 minutes, over half spent in counseling and coordination.  Multiple questions answered.  Thank you for the courtesy of this consult.  Please call me with any questions or concerns.  Nelida Meuse III  CC: Referring provider noted above

## 2019-01-06 NOTE — Patient Instructions (Signed)
If you are age 62 or older, your body mass index should be between 23-30. Your Body mass index is 30.41 kg/m. If this is out of the aforementioned range listed, please consider follow up with your Primary Care Provider.  If you are age 67 or younger, your body mass index should be between 19-25. Your Body mass index is 30.41 kg/m. If this is out of the aformentioned range listed, please consider follow up with your Primary Care Provider.   You have been scheduled for a colonoscopy. Please follow written instructions given to you at your visit today.  Please pick up your prep supplies at the pharmacy within the next 1-3 days. If you use inhalers (even only as needed), please bring them with you on the day of your procedure. Your physician has requested that you go to www.startemmi.com and enter the access code given to you at your visit today. This web site gives a general overview about your procedure. However, you should still follow specific instructions given to you by our office regarding your preparation for the procedure.  It was a pleasure to see you today!  Dr. Loletha Carrow

## 2019-01-13 DIAGNOSIS — N5201 Erectile dysfunction due to arterial insufficiency: Secondary | ICD-10-CM | POA: Diagnosis not present

## 2019-01-13 DIAGNOSIS — R3912 Poor urinary stream: Secondary | ICD-10-CM | POA: Diagnosis not present

## 2019-01-27 ENCOUNTER — Ambulatory Visit (INDEPENDENT_AMBULATORY_CARE_PROVIDER_SITE_OTHER)
Admission: RE | Admit: 2019-01-27 | Discharge: 2019-01-27 | Disposition: A | Discharge status: Home / Self Care | Payer: Medicare HMO | Source: Ambulatory Visit | Attending: Acute Care | Admitting: Acute Care

## 2019-01-27 ENCOUNTER — Ambulatory Visit: Payer: Medicare HMO

## 2019-01-27 ENCOUNTER — Other Ambulatory Visit: Payer: Self-pay

## 2019-01-27 DIAGNOSIS — F17210 Nicotine dependence, cigarettes, uncomplicated: Secondary | ICD-10-CM | POA: Diagnosis not present

## 2019-01-27 DIAGNOSIS — Z122 Encounter for screening for malignant neoplasm of respiratory organs: Secondary | ICD-10-CM

## 2019-01-27 DIAGNOSIS — R69 Illness, unspecified: Secondary | ICD-10-CM | POA: Diagnosis not present

## 2019-01-30 ENCOUNTER — Ambulatory Visit (AMBULATORY_SURGERY_CENTER): Payer: Medicare HMO | Admitting: Gastroenterology

## 2019-01-30 ENCOUNTER — Encounter: Payer: Self-pay | Admitting: Gastroenterology

## 2019-01-30 ENCOUNTER — Other Ambulatory Visit: Payer: Self-pay

## 2019-01-30 VITALS — BP 134/78 | HR 56 | Temp 98.8°F | Resp 17 | Ht 68.0 in | Wt 200.0 lb

## 2019-01-30 DIAGNOSIS — D123 Benign neoplasm of transverse colon: Secondary | ICD-10-CM | POA: Diagnosis not present

## 2019-01-30 DIAGNOSIS — Z1211 Encounter for screening for malignant neoplasm of colon: Secondary | ICD-10-CM | POA: Diagnosis not present

## 2019-01-30 DIAGNOSIS — D122 Benign neoplasm of ascending colon: Secondary | ICD-10-CM | POA: Diagnosis not present

## 2019-01-30 DIAGNOSIS — K635 Polyp of colon: Secondary | ICD-10-CM | POA: Diagnosis not present

## 2019-01-30 DIAGNOSIS — D120 Benign neoplasm of cecum: Secondary | ICD-10-CM | POA: Diagnosis not present

## 2019-01-30 MED ORDER — SODIUM CHLORIDE 0.9 % IV SOLN: 500.0000 mL | Freq: Once | INTRAVENOUS | Status: DC

## 2019-01-30 NOTE — Op Note (Signed)
[facility] Patient Name: Benjamin Serrano Procedure Date: 01/30/2019 1:50 PM MRN: [id number] Endoscopist: Mallie Mussel L. Loletha Carrow , MD Age: 63 Referring MD:  Date of Birth: [birth year] Gender: Male Account #: [id number] Procedure:                Colonoscopy Indications:              Screening for colorectal malignant neoplasm, This                            is the patient's first colonoscopy Medicines:                Monitored Anesthesia Care Procedure:                Pre-Anesthesia Assessment:                           - Prior to the procedure, a History and Physical                            was performed, and patient medications and                            allergies were reviewed. The patient's tolerance of                            previous anesthesia was also reviewed. The risks                            and benefits of the procedure and the sedation                            options and risks were discussed with the patient.                            All questions were answered, and informed consent                            was obtained. Prior Anticoagulants: The patient has                            taken no previous anticoagulant or antiplatelet                            agents. ASA Grade Assessment: II - A patient with                            mild systemic disease. After reviewing the risks                            and benefits, the patient was deemed in                            satisfactory condition to undergo the procedure.  After obtaining informed consent, the colonoscope                            was passed under direct vision. Throughout the                            procedure, the patient's blood pressure, pulse, and                            oxygen saturations were monitored continuously. The                            Colonoscope was introduced through the anus and                            advanced to the the cecum,  identified by                            appendiceal orifice and ileocecal valve. The                            colonoscopy was performed without difficulty. The                            patient tolerated the procedure well. The quality                            of the bowel preparation was good. The ileocecal                            valve, appendiceal orifice, and rectum were                            photographed. Scope In: 2:06:29 PM Scope Out: 2:28:03 PM Total Procedure Duration: 0 hours 21 minutes 34 seconds  Findings:                 The perianal and digital rectal examinations were                            normal.                           A 6 mm polyp was found in the mid transverse colon.                            The polyp was sessile. The polyp was removed with a                            cold snare. Resection and retrieval were complete.                            (Jar 1)  A diminutive polyp was found in the ileocecal                            valve. The polyp was sessile. The polyp was removed                            with a cold biopsy forceps. Resection and retrieval                            were complete.(Jar 2)                           Two sessile polyps were found in the ascending                            colon. The polyps were 6 mm in size. These polyps                            were removed with a cold snare. Resection and                            retrieval were complete. (Jar 2)                           Two flat polyps with mucus caps were found in the                            transverse colon. The polyps were 3 to 5 mm in                            size. These polyps were removed with a cold snare.                            Resection and retrieval were complete.                           The exam was otherwise without abnormality on                            direct and retroflexion views. Complications:             No immediate complications. Estimated Blood Loss:     Estimated blood loss was minimal. Impression:               - One 6 mm polyp in the mid transverse colon,                            removed with a cold snare. Resected and retrieved.                           - One diminutive polyp at the ileocecal valve,  removed with a cold biopsy forceps. Resected and                            retrieved.                           - Two 3 to 5 mm polyps in the ascending colon,                            removed with a cold snare. Resected and retrieved.                           - Two 6 mm polyps in the transverse colon, removed                            with a cold snare. Resected and retrieved.                           - The examination was otherwise normal on direct                            and retroflexion views. Recommendation:           - Patient has a contact number available for                            emergencies. The signs and symptoms of potential                            delayed complications were discussed with the                            patient. Return to normal activities tomorrow.                            Written discharge instructions were provided to the                            patient.                           - Resume previous diet.                           - Continue present medications.                           - Await pathology results.                           - Repeat colonoscopy is recommended for                            surveillance. The colonoscopy date will be  determined after pathology results from today's                            exam become available for review. Sharia Averitt L. Loletha Carrow, MD 01/30/2019 2:36:40 PM This report has been signed electronically.

## 2019-01-30 NOTE — Patient Instructions (Signed)
Information on polyps given to you today.  Await pathology results.   YOU HAD AN ENDOSCOPIC PROCEDURE TODAY AT THE [facility]:   Refer to the procedure report that was given to you for any specific questions about what was found during the examination.  If the procedure report does not answer your questions, please call your gastroenterologist to clarify.  If you requested that your care partner not be given the details of your procedure findings, then the procedure report has been included in a sealed envelope for you to review at your convenience later.  YOU SHOULD EXPECT: Some feelings of bloating in the abdomen. Passage of more gas than usual.  Walking can help get rid of the air that was put into your GI tract during the procedure and reduce the bloating. If you had a lower endoscopy (such as a colonoscopy or flexible sigmoidoscopy) you may notice spotting of blood in your stool or on the toilet paper. If you underwent a bowel prep for your procedure, you may not have a normal bowel movement for a few days.  Please Note:  You might notice some irritation and congestion in your nose or some drainage.  This is from the oxygen used during your procedure.  There is no need for concern and it should clear up in a day or so.  SYMPTOMS TO REPORT IMMEDIATELY:   Following lower endoscopy (colonoscopy or flexible sigmoidoscopy):  Excessive amounts of blood in the stool  Significant tenderness or worsening of abdominal pains  Swelling of the abdomen that is new, acute  Fever of 100F or higher   For urgent or emergent issues, a gastroenterologist can be reached at any hour by calling [phone].   DIET:  We do recommend a small meal at first, but then you may proceed to your regular diet.  Drink plenty of fluids but you should avoid alcoholic beverages for 24 hours.  ACTIVITY:  You should plan to take it easy for the rest of today and you should NOT DRIVE or use heavy machinery  until tomorrow (because of the sedation medicines used during the test).    FOLLOW UP: Our staff will call the number listed on your records 48-72 hours following your procedure to check on you and address any questions or concerns that you may have regarding the information given to you following your procedure. If we do not reach you, we will leave a message.  We will attempt to reach you two times.  During this call, we will ask if you have developed any symptoms of COVID 19. If you develop any symptoms (ie: fever, flu-like symptoms, shortness of breath, cough etc.) before then, please call [phone].  If you test positive for Covid 19 in the 2 weeks post procedure, please call and report this information to us.    If any biopsies were taken you will be contacted by phone or by letter within the next 1-3 weeks.  Please call us at [phone] if you have not heard about the biopsies in 3 weeks.    SIGNATURES/CONFIDENTIALITY: You and/or your care partner have signed paperwork which will be entered into your electronic medical record.  These signatures attest to the fact that that the information above on your After Visit Summary has been reviewed and is understood.  Full responsibility of the confidentiality of this discharge information lies with you and/or your care-partner. 

## 2019-01-30 NOTE — Progress Notes (Signed)
Called to room to assist during endoscopic procedure.  Patient ID and intended procedure confirmed with present staff. Received instructions for my participation in the procedure from the performing physician.  

## 2019-01-30 NOTE — Progress Notes (Signed)
To PACU, VSS. Report to [email or web address] 

## 2019-01-30 NOTE — Progress Notes (Signed)
Pt's states no medical or surgical changes since previsit or office visit.  Temp- June Vitals- Courtney 

## 2019-02-01 ENCOUNTER — Telehealth: Payer: Self-pay

## 2019-02-01 NOTE — Telephone Encounter (Signed)
  Follow up Call-  Call back number 01/30/2019  Post procedure Call Back phone  # [phone]  Permission to leave phone message Yes  Some recent data might be hidden     Patient questions:  Do you have a fever, pain , or abdominal swelling? No. Pain Score  0 *  Have you tolerated food without any problems? Yes.    Have you been able to return to your normal activities? Yes.    Do you have any questions about your discharge instructions: Diet   No. Medications  No. Follow up visit  No.  Do you have questions or concerns about your Care? No.  Actions: * If pain score is 4 or above: No action needed, pain <4.  1. Have you developed a fever since your procedure? No.  2.   Have you had an respiratory symptoms (SOB or cough) since your procedure? No.  3.   Have you tested positive for COVID 19 since your procedure No.  4.   Have you had any family members/close contacts diagnosed with the COVID 19 since your procedure?  No.   If yes to any of these questions please route to Joylene John, RN and Alphonsa Gin, RN.

## 2019-02-03 ENCOUNTER — Encounter: Payer: Self-pay | Admitting: Gastroenterology

## 2019-02-07 ENCOUNTER — Other Ambulatory Visit: Payer: Self-pay | Admitting: *Deleted

## 2019-02-07 DIAGNOSIS — Z87891 Personal history of nicotine dependence: Secondary | ICD-10-CM

## 2019-02-07 DIAGNOSIS — F17210 Nicotine dependence, cigarettes, uncomplicated: Secondary | ICD-10-CM

## 2019-02-07 DIAGNOSIS — Z122 Encounter for screening for malignant neoplasm of respiratory organs: Secondary | ICD-10-CM

## 2019-02-27 ENCOUNTER — Other Ambulatory Visit: Payer: Self-pay | Admitting: Family

## 2019-02-27 DIAGNOSIS — E119 Type 2 diabetes mellitus without complications: Secondary | ICD-10-CM

## 2019-02-27 MED ORDER — METFORMIN HCL 1000 MG PO TABS: 1000.0000 mg | ORAL_TABLET | Freq: Two times a day (BID) | ORAL | 0 refills | Status: DC

## 2019-03-08 ENCOUNTER — Other Ambulatory Visit: Payer: Self-pay | Admitting: Internal Medicine

## 2019-03-08 DIAGNOSIS — R69 Illness, unspecified: Secondary | ICD-10-CM | POA: Diagnosis not present

## 2019-03-08 NOTE — Telephone Encounter (Signed)
Pt seen Benjamin Serrano last and has upcoming appt with her

## 2019-04-22 ENCOUNTER — Other Ambulatory Visit: Payer: Self-pay | Admitting: Family

## 2019-04-26 ENCOUNTER — Telehealth: Payer: Self-pay | Admitting: *Deleted

## 2019-04-26 DIAGNOSIS — E119 Type 2 diabetes mellitus without complications: Secondary | ICD-10-CM

## 2019-04-26 DIAGNOSIS — R69 Illness, unspecified: Secondary | ICD-10-CM | POA: Diagnosis not present

## 2019-04-26 MED ORDER — ONETOUCH ULTRA VI STRP: ORAL_STRIP | 0 refills | Status: DC

## 2019-04-26 NOTE — Telephone Encounter (Signed)
Patient contacted nurse stating that he needs a refill for his glucose test strips. Nurse sent refill order to pharmacy. Patient does have an upcoming scheduled visit with provider Valere Dross on 06/05/19.

## 2019-06-02 ENCOUNTER — Other Ambulatory Visit: Payer: Self-pay | Admitting: Family

## 2019-06-02 DIAGNOSIS — E119 Type 2 diabetes mellitus without complications: Secondary | ICD-10-CM

## 2019-06-05 ENCOUNTER — Ambulatory Visit (INDEPENDENT_AMBULATORY_CARE_PROVIDER_SITE_OTHER): Payer: Medicare HMO | Admitting: Family

## 2019-06-05 ENCOUNTER — Other Ambulatory Visit: Payer: Self-pay

## 2019-06-05 ENCOUNTER — Other Ambulatory Visit: Payer: Self-pay | Admitting: Family

## 2019-06-05 ENCOUNTER — Encounter: Payer: Self-pay | Admitting: Family

## 2019-06-05 ENCOUNTER — Telehealth: Payer: Self-pay | Admitting: Family

## 2019-06-05 VITALS — BP 130/80 | HR 95 | Temp 97.8°F | Ht 68.0 in | Wt 209.2 lb

## 2019-06-05 DIAGNOSIS — E119 Type 2 diabetes mellitus without complications: Secondary | ICD-10-CM

## 2019-06-05 DIAGNOSIS — I10 Essential (primary) hypertension: Secondary | ICD-10-CM | POA: Diagnosis not present

## 2019-06-05 DIAGNOSIS — R972 Elevated prostate specific antigen [PSA]: Secondary | ICD-10-CM

## 2019-06-05 LAB — POCT GLYCOSYLATED HEMOGLOBIN (HGB A1C): Hemoglobin A1C: 7.1 % — AB (ref 4.0–5.6)

## 2019-06-05 MED ORDER — METFORMIN HCL 1000 MG PO TABS: 1000.0000 mg | ORAL_TABLET | Freq: Two times a day (BID) | ORAL | 1 refills | Status: DC

## 2019-06-05 MED ORDER — GLIPIZIDE 5 MG PO TABS: ORAL_TABLET | ORAL | 1 refills | Status: DC

## 2019-06-05 MED ORDER — AMLODIPINE BESYLATE 10 MG PO TABS: 10.0000 mg | ORAL_TABLET | Freq: Every day | ORAL | 1 refills | Status: DC

## 2019-06-05 MED ORDER — ONETOUCH ULTRA VI STRP: ORAL_STRIP | 6 refills | Status: DC

## 2019-06-05 NOTE — Progress Notes (Signed)
Benjamin Serrano is a 64 y.o. male with the following history as recorded in EpicCare:  Patient Active Problem List   Diagnosis Date Noted  . Routine general medical examination at a health care facility 11/29/2017  . Erectile dysfunction 05/29/2017  . Type 2 diabetes mellitus ([place]) 09/23/2016  . Hyperlipidemia 09/23/2016  . Essential hypertension 09/23/2016    Current Outpatient Medications  Medication Sig Dispense Refill  . amLODipine (NORVASC) 10 MG tablet Take 1 tablet (10 mg total) by mouth daily. 90 tablet 1  . aspirin EC 81 MG tablet Take 81 mg by mouth daily.    . Blood Glucose Monitoring Suppl (ONE TOUCH ULTRA 2) w/Device KIT Use to check blood sugars twice a day 1 each 0  . glipiZIDE (GLUCOTROL) 5 MG tablet Take 1 tablet by mouth once daily 90 tablet 1  . glucose blood (ONETOUCH ULTRA) test strip USE 1 STRIP TO CHECK GLUCOSE TWICE DAILY 100 each 6  . metFORMIN (GLUCOPHAGE) 1000 MG tablet Take 1 tablet (1,000 mg total) by mouth 2 (two) times daily with a meal. 180 tablet 1  . ONETOUCH DELICA LANCETS 50D MISC Use to check blood sugars 2 times daily 100 each 5  . rosuvastatin (CRESTOR) 20 MG tablet Take 1 tablet (20 mg total) by mouth daily. 90 tablet 3  . tamsulosin (FLOMAX) 0.4 MG CAPS capsule Take 0.4 mg by mouth daily.    Marland Kitchen ammonium lactate (LAC-HYDRIN) 12 % cream Apply topically as needed for dry skin. (Patient not taking: Reported on 01/06/2019) 385 g 0   No current facility-administered medications for this visit.    Allergies: Patient has no known allergies.  Past Medical History:  Diagnosis Date  . Asthma   . Depression   . Diabetes mellitus without complication ([place])   . Hyperlipemia   . Hypertension     Past Surgical History:  Procedure Laterality Date  . Arm surgery Right   . LEG SURGERY      Family History  Problem Relation Age of Onset  . Diabetes Mother   . Alzheimer's disease Maternal Grandmother   . Hypertension Maternal Grandmother   . Colon  cancer Neg Hx   . Colon polyps Neg Hx     Social History   Tobacco Use  . Smoking status: Current Every Day Smoker    Packs/day: 2.00    Years: 43.00    Pack years: 86.00    Types: Cigarettes  . Smokeless tobacco: Never Used  Substance Use Topics  . Alcohol use: Yes    Comment: social    Subjective:  Patient presents for a 6 month follow-up on his Type 2 Diabetes; his Hgba1c looks very good today at 7.1; notes that he would prefer to follow-up with a MD- will need to see if Dr. Jenny Reichmann is willing to have patient transfer; Denies any chest pain, shortness of breath, blurred vision or headache. Patient was asked to see urology last summer about his elevated PSA- notes that he does not want to schedule that follow-up. Does not want the referral updated today;    Objective:  Vitals:   06/05/19 0931  BP: 130/80  Pulse: 95  Temp: 97.8 F (36.6 C)  TempSrc: Oral  SpO2: 97%  Weight: 209 lb 3.2 oz (94.9 kg)  Height: _0  (1.727 m)    General: Well developed, well nourished, in no acute distress  Skin : Warm and dry.  Head: Normocephalic and atraumatic  Lungs: Respirations unlabored; clear to auscultation bilaterally  without wheeze, rales, rhonchi  CVS exam: normal rate and regular rhythm.  Neurologic: Alert and oriented; speech intact; face symmetrical; moves all extremities well; CNII-XII intact without focal deficit    Assessment:  1. Type 2 diabetes mellitus without complication, without long-term current use of insulin ([place])   2. Essential hypertension   3. Elevated PSA     Plan:  1. Stable; Hgba1c today at 7.1; refills updated as requested; plan to see Dr. Jenny Reichmann in follow-up going forward. 2. Stable; refills updated; continue same medications; 3. Patient defers urology follow-up today;   Return in about 6 months (around 12/03/2019), or wit Dr. Jenny Reichmann.  Orders Placed This Encounter  Procedures  . POCT HgB A1C    Requested Prescriptions   Signed Prescriptions Disp  Refills  . metFORMIN (GLUCOPHAGE) 1000 MG tablet 180 tablet 1    Sig: Take 1 tablet (1,000 mg total) by mouth 2 (two) times daily with a meal.  . glipiZIDE (GLUCOTROL) 5 MG tablet 90 tablet 1    Sig: Take 1 tablet by mouth once daily  . amLODipine (NORVASC) 10 MG tablet 90 tablet 1    Sig: Take 1 tablet (10 mg total) by mouth daily.  Marland Kitchen glucose blood (ONETOUCH ULTRA) test strip 100 each 6    Sig: USE 1 STRIP TO CHECK GLUCOSE TWICE DAILY

## 2019-06-05 NOTE — Telephone Encounter (Signed)
Patient is asking for a male MD for continued care; was Ashleigh's patient; would you be willing to let him transfer to you?   Thanks-

## 2019-06-05 NOTE — Telephone Encounter (Signed)
Will you call him to schedule with Dr. Jenny Reichmann in July 2021? He says it's fine for TOC. Thanks-

## 2019-06-05 NOTE — Telephone Encounter (Signed)
LVM for patient to call back and set up a TOC appointment with Dr.John.

## 2019-06-05 NOTE — Telephone Encounter (Signed)
Ok with me 

## 2019-07-17 ENCOUNTER — Other Ambulatory Visit: Payer: Self-pay | Admitting: Gastroenterology

## 2019-10-10 DIAGNOSIS — R3912 Poor urinary stream: Secondary | ICD-10-CM | POA: Diagnosis not present

## 2019-10-10 DIAGNOSIS — N5201 Erectile dysfunction due to arterial insufficiency: Secondary | ICD-10-CM | POA: Diagnosis not present

## 2019-10-11 ENCOUNTER — Telehealth: Payer: Self-pay | Admitting: Internal Medicine

## 2019-10-12 DIAGNOSIS — E119 Type 2 diabetes mellitus without complications: Secondary | ICD-10-CM | POA: Diagnosis not present

## 2019-10-12 DIAGNOSIS — H2513 Age-related nuclear cataract, bilateral: Secondary | ICD-10-CM | POA: Diagnosis not present

## 2019-10-12 DIAGNOSIS — H40013 Open angle with borderline findings, low risk, bilateral: Secondary | ICD-10-CM | POA: Diagnosis not present

## 2019-10-27 NOTE — Telephone Encounter (Signed)
error 

## 2019-10-30 ENCOUNTER — Other Ambulatory Visit: Payer: Self-pay | Admitting: Internal Medicine

## 2019-10-30 NOTE — Telephone Encounter (Signed)
Pt is scheduled with Dr. Jenny Reichmann 12/15/2019

## 2019-11-14 DIAGNOSIS — R3912 Poor urinary stream: Secondary | ICD-10-CM | POA: Diagnosis not present

## 2019-11-14 DIAGNOSIS — R3121 Asymptomatic microscopic hematuria: Secondary | ICD-10-CM | POA: Diagnosis not present

## 2019-11-14 DIAGNOSIS — N401 Enlarged prostate with lower urinary tract symptoms: Secondary | ICD-10-CM | POA: Diagnosis not present

## 2019-12-13 DIAGNOSIS — R3121 Asymptomatic microscopic hematuria: Secondary | ICD-10-CM | POA: Diagnosis not present

## 2019-12-13 DIAGNOSIS — N401 Enlarged prostate with lower urinary tract symptoms: Secondary | ICD-10-CM | POA: Diagnosis not present

## 2019-12-13 DIAGNOSIS — R3912 Poor urinary stream: Secondary | ICD-10-CM | POA: Diagnosis not present

## 2019-12-14 ENCOUNTER — Ambulatory Visit: Payer: Self-pay

## 2019-12-15 ENCOUNTER — Ambulatory Visit: Payer: Self-pay

## 2019-12-15 ENCOUNTER — Ambulatory Visit (INDEPENDENT_AMBULATORY_CARE_PROVIDER_SITE_OTHER): Payer: Medicare HMO | Admitting: Internal Medicine

## 2019-12-15 ENCOUNTER — Other Ambulatory Visit: Payer: Self-pay

## 2019-12-15 ENCOUNTER — Encounter: Payer: Self-pay | Admitting: Internal Medicine

## 2019-12-15 VITALS — BP 140/80 | HR 69 | Temp 99.0°F | Ht 68.0 in | Wt 197.0 lb

## 2019-12-15 DIAGNOSIS — E119 Type 2 diabetes mellitus without complications: Secondary | ICD-10-CM

## 2019-12-15 DIAGNOSIS — Z0000 Encounter for general adult medical examination without abnormal findings: Secondary | ICD-10-CM | POA: Diagnosis not present

## 2019-12-15 NOTE — Progress Notes (Signed)
Subjective:    Patient ID: Benjamin Serrano, male    DOB: 12/04/1955, 64 y.o.   MRN: [id number]  HPI  Here for wellness and f/u;  Overall doing ok;  Pt denies Chest pain, worsening SOB, DOE, wheezing, orthopnea, PND, worsening LE edema, palpitations, dizziness or syncope.  Pt denies neurological change such as new headache, facial or extremity weakness.  Pt denies polydipsia, polyuria, or low sugar symptoms. Pt states overall good compliance with treatment and medications, good tolerability, and has been trying to follow appropriate diet.  Pt denies worsening depressive symptoms, suicidal ideation or panic. No fever, night sweats, wt loss, loss of appetite, or other constitutional symptoms.  Pt states good ability with ADL's, has low fall risk, home safety reviewed and adequate, no other significant changes in hearing or vision, and only occasionally active with exercise.  Had urology procedure on Monday July 26, now supposed to hold the metformin to Saturday per pt, after he also states he is supposed to be off 2 days.  Past Medical History:  Diagnosis Date  . Asthma   . Depression   . Diabetes mellitus without complication ([place])   . Hyperlipemia   . Hypertension    Past Surgical History:  Procedure Laterality Date  . Arm surgery Right   . LEG SURGERY      reports that he has been smoking cigarettes. He has a 86.00 pack-year smoking history. He has never used smokeless tobacco. He reports current alcohol use. He reports that he does not use drugs. family history includes Alzheimer's disease in his maternal grandmother; Diabetes in his mother; Hypertension in his maternal grandmother. No Known Allergies Current Outpatient Medications on File Prior to Visit  Medication Sig Dispense Refill  . alfuzosin (UROXATRAL) 10 MG 24 hr tablet Take 10 mg by mouth at bedtime.    Marland Kitchen amLODipine (NORVASC) 10 MG tablet Take 1 tablet (10 mg total) by mouth daily. 90 tablet 1  . aspirin EC 81 MG tablet Take  81 mg by mouth daily.    . Blood Glucose Monitoring Suppl (ONE TOUCH ULTRA 2) w/Device KIT Use to check blood sugars twice a day 1 each 0  . finasteride (PROSCAR) 5 MG tablet Take 5 mg by mouth daily.    Marland Kitchen glipiZIDE (GLUCOTROL) 5 MG tablet Take 1 tablet by mouth once daily 90 tablet 1  . glucose blood (ONETOUCH ULTRA) test strip USE 1 STRIP TO CHECK GLUCOSE TWICE DAILY 100 each 6  . metFORMIN (GLUCOPHAGE) 1000 MG tablet Take 1 tablet (1,000 mg total) by mouth 2 (two) times daily with a meal. 180 tablet 1  . ONETOUCH DELICA LANCETS 84Y MISC Use to check blood sugars 2 times daily 100 each 5  . rosuvastatin (CRESTOR) 20 MG tablet Take 1 tablet (20 mg total) by mouth daily. 90 tablet 3  . tamsulosin (FLOMAX) 0.4 MG CAPS capsule Take 1 capsule (0.4 mg total) by mouth daily. Annual appt due in July must see provider for future refills 30 capsule 0   No current facility-administered medications on file prior to visit.   Review of Systems All otherwise neg per pt     Objective:   Physical Exam BP (!) 140/80 (BP Location: Left Arm, Patient Position: Sitting, Cuff Size: Large)   Pulse 69   Temp 99 F (37.2 C) (Oral)   Ht '5\' 8"'  (1.727 m)   Wt 197 lb (89.4 kg)   SpO2 97%   BMI 29.95 kg/m  VS noted,  Constitutional: Pt appears in NAD HENT: Head: NCAT.  Right Ear: External ear normal.  Left Ear: External ear normal.  Eyes: . Pupils are equal, round, and reactive to light. Conjunctivae and EOM are normal Nose: without d/c or deformity Neck: Neck supple. Gross normal ROM Cardiovascular: Normal rate and regular rhythm.   Pulmonary/Chest: Effort normal and breath sounds without rales or wheezing.  Abd:  Soft, NT, ND, + BS, no organomegaly Neurological: Pt is alert. At baseline orientation, motor grossly intact Skin: Skin is warm. No rashes, other new lesions, no LE edema Psychiatric: Pt behavior is normal without agitation  All otherwise neg per pt Lab Results  Component Value Date   WBC  5.3 12/15/2019   HGB 14.5 12/15/2019   HCT 42.3 12/15/2019   PLT 282 12/15/2019   GLUCOSE 110 (H) 12/15/2019   CHOL 145 12/15/2019   TRIG 122 12/15/2019   HDL 45 12/15/2019   LDLCALC 78 12/15/2019   ALT 17 12/15/2019   AST 16 12/15/2019   NA 140 12/15/2019   K 4.2 12/15/2019   CL 107 12/15/2019   CREATININE 1.09 12/15/2019   BUN 16 12/15/2019   CO2 24 12/15/2019   TSH 1.21 12/15/2019   PSA 4.50 (H) 12/05/2018   HGBA1C 7.3 (H) 12/15/2019   MICROALBUR 1.0 12/15/2019          Assessment & Plan:

## 2019-12-15 NOTE — Patient Instructions (Signed)

## 2019-12-16 LAB — COMPLETE METABOLIC PANEL WITH GFR
AG Ratio: 1.7 (calc) (ref 1.0–2.5)
ALT: 17 U/L (ref 9–46)
AST: 16 U/L (ref 10–35)
Albumin: 4.2 g/dL (ref 3.6–5.1)
Alkaline phosphatase (APISO): 62 U/L (ref 35–144)
BUN: 16 mg/dL (ref 7–25)
CO2: 24 mmol/L (ref 20–32)
Calcium: 9.3 mg/dL (ref 8.6–10.3)
Chloride: 107 mmol/L (ref 98–110)
Creat: 1.09 mg/dL (ref 0.70–1.25)
GFR, Est African American: 83 mL/min/{1.73_m2} (ref >=60)
GFR, Est Non African American: 72 mL/min/{1.73_m2} (ref >=60)
Globulin: 2.5 g/dL (calc) (ref 1.9–3.7)
Glucose, Bld: 110 mg/dL — ABNORMAL HIGH (ref 65–99)
Potassium: 4.2 mmol/L (ref 3.5–5.3)
Sodium: 140 mmol/L (ref 135–146)
Total Bilirubin: 0.3 mg/dL (ref 0.2–1.2)
Total Protein: 6.7 g/dL (ref 6.1–8.1)

## 2019-12-16 LAB — CBC WITH DIFFERENTIAL/PLATELET
Absolute Monocytes: 440 cells/uL (ref 200–950)
Basophils Absolute: 32 cells/uL (ref 0–200)
Basophils Relative: 0.6 %
Eosinophils Absolute: 48 cells/uL (ref 15–500)
Eosinophils Relative: 0.9 %
HCT: 42.3 % (ref 38.5–50.0)
Hemoglobin: 14.5 g/dL (ref 13.2–17.1)
Lymphs Abs: 1330 cells/uL (ref 850–3900)
MCH: 31 pg (ref 27.0–33.0)
MCHC: 34.3 g/dL (ref 32.0–36.0)
MCV: 90.4 fL (ref 80.0–100.0)
MPV: 10.2 fL (ref 7.5–12.5)
Monocytes Relative: 8.3 %
Neutro Abs: 3450 cells/uL (ref 1500–7800)
Neutrophils Relative %: 65.1 %
Platelets: 282 10*3/uL (ref 140–400)
RBC: 4.68 10*6/uL (ref 4.20–5.80)
RDW: 13.5 % (ref 11.0–15.0)
Total Lymphocyte: 25.1 %
WBC: 5.3 10*3/uL (ref 3.8–10.8)

## 2019-12-16 LAB — URINALYSIS, ROUTINE W REFLEX MICROSCOPIC
Bacteria, UA: NONE SEEN /HPF
Bilirubin Urine: NEGATIVE
Hgb urine dipstick: NEGATIVE
Hyaline Cast: NONE SEEN /LPF
Ketones, ur: NEGATIVE
Nitrite: NEGATIVE
Protein, ur: NEGATIVE
RBC / HPF: NONE SEEN /HPF (ref 0–2)
Specific Gravity, Urine: 1.027 (ref 1.001–1.03)
Squamous Epithelial / HPF: NONE SEEN /HPF (ref <=5)
pH: <=5 (ref 5.0–8.0)

## 2019-12-16 LAB — HEMOGLOBIN A1C
Hgb A1c MFr Bld: 7.3 % of total Hgb — ABNORMAL HIGH (ref <5.7)
Mean Plasma Glucose: 163 (calc)
eAG (mmol/L): 9 (calc)

## 2019-12-16 LAB — TSH: TSH: 1.21 mIU/L (ref 0.40–4.50)

## 2019-12-16 LAB — MICROALBUMIN / CREATININE URINE RATIO
Creatinine, Urine: 214 mg/dL (ref 20–320)
Microalb Creat Ratio: 5 mcg/mg creat (ref <30)
Microalb, Ur: 1 mg/dL

## 2019-12-16 LAB — LIPID PANEL
Cholesterol: 145 mg/dL (ref <200)
HDL: 45 mg/dL (ref >=40)
LDL Cholesterol (Calc): 78 mg/dL (calc)
Non-HDL Cholesterol (Calc): 100 mg/dL (calc) (ref <130)
Total CHOL/HDL Ratio: 3.2 (calc) (ref <5.0)
Triglycerides: 122 mg/dL (ref <150)

## 2019-12-17 ENCOUNTER — Encounter: Payer: Self-pay | Admitting: Internal Medicine

## 2019-12-17 NOTE — Assessment & Plan Note (Signed)
Lab Results  Component Value Date   HGBA1C 7.3 (H) 12/15/2019  stable overall by history and exam, recent data reviewed with pt, and pt to continue medical treatment as before,  to f/u any worsening symptoms or concerns

## 2019-12-17 NOTE — Assessment & Plan Note (Signed)

## 2019-12-19 ENCOUNTER — Other Ambulatory Visit: Payer: Self-pay | Admitting: Family

## 2019-12-19 ENCOUNTER — Other Ambulatory Visit: Payer: Self-pay

## 2019-12-19 DIAGNOSIS — I10 Essential (primary) hypertension: Secondary | ICD-10-CM

## 2019-12-26 ENCOUNTER — Other Ambulatory Visit: Payer: Self-pay | Admitting: Family

## 2019-12-27 ENCOUNTER — Other Ambulatory Visit: Payer: Self-pay | Admitting: Family

## 2020-02-24 ENCOUNTER — Other Ambulatory Visit: Payer: Self-pay | Admitting: Family

## 2020-02-24 DIAGNOSIS — E119 Type 2 diabetes mellitus without complications: Secondary | ICD-10-CM

## 2020-03-01 DIAGNOSIS — E669 Obesity, unspecified: Secondary | ICD-10-CM | POA: Diagnosis not present

## 2020-03-01 DIAGNOSIS — M2550 Pain in unspecified joint: Secondary | ICD-10-CM | POA: Diagnosis not present

## 2020-03-01 DIAGNOSIS — E1140 Type 2 diabetes mellitus with diabetic neuropathy, unspecified: Secondary | ICD-10-CM | POA: Diagnosis not present

## 2020-03-01 DIAGNOSIS — G8929 Other chronic pain: Secondary | ICD-10-CM | POA: Diagnosis not present

## 2020-03-01 DIAGNOSIS — E1136 Type 2 diabetes mellitus with diabetic cataract: Secondary | ICD-10-CM | POA: Diagnosis not present

## 2020-03-01 DIAGNOSIS — I10 Essential (primary) hypertension: Secondary | ICD-10-CM | POA: Diagnosis not present

## 2020-03-01 DIAGNOSIS — R69 Illness, unspecified: Secondary | ICD-10-CM | POA: Diagnosis not present

## 2020-03-01 DIAGNOSIS — H547 Unspecified visual loss: Secondary | ICD-10-CM | POA: Diagnosis not present

## 2020-03-01 DIAGNOSIS — E785 Hyperlipidemia, unspecified: Secondary | ICD-10-CM | POA: Diagnosis not present

## 2020-03-04 ENCOUNTER — Other Ambulatory Visit: Payer: Self-pay | Admitting: Family

## 2020-03-04 DIAGNOSIS — E119 Type 2 diabetes mellitus without complications: Secondary | ICD-10-CM

## 2020-03-15 DIAGNOSIS — N401 Enlarged prostate with lower urinary tract symptoms: Secondary | ICD-10-CM | POA: Diagnosis not present

## 2020-03-15 DIAGNOSIS — R3121 Asymptomatic microscopic hematuria: Secondary | ICD-10-CM | POA: Diagnosis not present

## 2020-03-15 DIAGNOSIS — R3912 Poor urinary stream: Secondary | ICD-10-CM | POA: Diagnosis not present

## 2020-03-18 ENCOUNTER — Other Ambulatory Visit: Payer: Self-pay | Admitting: Internal Medicine

## 2020-03-18 DIAGNOSIS — I10 Essential (primary) hypertension: Secondary | ICD-10-CM

## 2020-03-18 NOTE — Telephone Encounter (Signed)
lease refill as per office routine med refill policy (all routine meds refilled for 3 mo or monthly per pt preference up to one year from last visit, then month to month grace period for 3 mo, then further med refills will have to be denied)

## 2020-03-26 ENCOUNTER — Other Ambulatory Visit: Payer: Self-pay | Admitting: Internal Medicine

## 2020-03-26 NOTE — Telephone Encounter (Signed)
Please refill as per office routine med refill policy (all routine meds refilled for 3 mo or monthly per pt preference up to one year from last visit, then month to month grace period for 3 mo, then further med refills will have to be denied)  

## 2020-03-28 ENCOUNTER — Other Ambulatory Visit: Payer: Self-pay

## 2020-03-28 MED ORDER — ROSUVASTATIN CALCIUM 20 MG PO TABS
20.0000 mg | ORAL_TABLET | Freq: Every day | ORAL | 0 refills | Status: DC
Start: 2020-03-28 — End: 2020-06-26

## 2020-05-21 ENCOUNTER — Other Ambulatory Visit: Payer: Self-pay | Admitting: Internal Medicine

## 2020-05-21 DIAGNOSIS — E119 Type 2 diabetes mellitus without complications: Secondary | ICD-10-CM

## 2020-05-21 NOTE — Telephone Encounter (Signed)
Please refill as per office routine med refill policy (all routine meds refilled for 3 mo or monthly per pt preference up to one year from last visit, then month to month grace period for 3 mo, then further med refills will have to be denied)  

## 2020-05-22 ENCOUNTER — Other Ambulatory Visit: Payer: Self-pay | Admitting: *Deleted

## 2020-05-30 ENCOUNTER — Other Ambulatory Visit: Payer: Self-pay | Admitting: Internal Medicine

## 2020-05-30 DIAGNOSIS — E119 Type 2 diabetes mellitus without complications: Secondary | ICD-10-CM

## 2020-06-17 ENCOUNTER — Other Ambulatory Visit: Payer: Self-pay | Admitting: Internal Medicine

## 2020-06-17 DIAGNOSIS — I10 Essential (primary) hypertension: Secondary | ICD-10-CM

## 2020-06-17 NOTE — Telephone Encounter (Signed)
Please refill as per office routine med refill policy (all routine meds refilled for 3 mo or monthly per pt preference up to one year from last visit, then month to month grace period for 3 mo, then further med refills will have to be denied)  

## 2020-06-20 ENCOUNTER — Encounter: Payer: Self-pay | Admitting: Internal Medicine

## 2020-06-20 ENCOUNTER — Ambulatory Visit (INDEPENDENT_AMBULATORY_CARE_PROVIDER_SITE_OTHER): Payer: Medicare Other | Admitting: Internal Medicine

## 2020-06-20 ENCOUNTER — Other Ambulatory Visit: Payer: Self-pay

## 2020-06-20 VITALS — BP 136/70 | HR 70 | Temp 98.1°F | Ht 68.0 in | Wt 211.0 lb

## 2020-06-20 DIAGNOSIS — E559 Vitamin D deficiency, unspecified: Secondary | ICD-10-CM

## 2020-06-20 DIAGNOSIS — R480 Dyslexia and alexia: Secondary | ICD-10-CM

## 2020-06-20 DIAGNOSIS — E1165 Type 2 diabetes mellitus with hyperglycemia: Secondary | ICD-10-CM | POA: Diagnosis not present

## 2020-06-20 DIAGNOSIS — S069X9A Unspecified intracranial injury with loss of consciousness of unspecified duration, initial encounter: Secondary | ICD-10-CM | POA: Insufficient documentation

## 2020-06-20 DIAGNOSIS — S069XAA Unspecified intracranial injury with loss of consciousness status unknown, initial encounter: Secondary | ICD-10-CM

## 2020-06-20 DIAGNOSIS — E538 Deficiency of other specified B group vitamins: Secondary | ICD-10-CM | POA: Diagnosis not present

## 2020-06-20 DIAGNOSIS — E782 Mixed hyperlipidemia: Secondary | ICD-10-CM

## 2020-06-20 DIAGNOSIS — I700 Atherosclerosis of aorta: Secondary | ICD-10-CM | POA: Diagnosis not present

## 2020-06-20 DIAGNOSIS — F32A Depression, unspecified: Secondary | ICD-10-CM | POA: Insufficient documentation

## 2020-06-20 DIAGNOSIS — F419 Anxiety disorder, unspecified: Secondary | ICD-10-CM

## 2020-06-20 DIAGNOSIS — I10 Essential (primary) hypertension: Secondary | ICD-10-CM | POA: Diagnosis not present

## 2020-06-20 DIAGNOSIS — Z0001 Encounter for general adult medical examination with abnormal findings: Secondary | ICD-10-CM | POA: Diagnosis not present

## 2020-06-20 DIAGNOSIS — R413 Other amnesia: Secondary | ICD-10-CM | POA: Insufficient documentation

## 2020-06-20 DIAGNOSIS — N400 Enlarged prostate without lower urinary tract symptoms: Secondary | ICD-10-CM

## 2020-06-20 DIAGNOSIS — N401 Enlarged prostate with lower urinary tract symptoms: Secondary | ICD-10-CM

## 2020-06-20 DIAGNOSIS — R350 Frequency of micturition: Secondary | ICD-10-CM

## 2020-06-20 DIAGNOSIS — Z23 Encounter for immunization: Secondary | ICD-10-CM

## 2020-06-20 DIAGNOSIS — Z720 Tobacco use: Secondary | ICD-10-CM | POA: Insufficient documentation

## 2020-06-20 HISTORY — DX: Other amnesia: R41.3

## 2020-06-20 HISTORY — DX: Unspecified intracranial injury with loss of consciousness of unspecified duration, initial encounter: S06.9X9A

## 2020-06-20 HISTORY — DX: Atherosclerosis of aorta: I70.0

## 2020-06-20 HISTORY — DX: Benign prostatic hyperplasia without lower urinary tract symptoms: N40.0

## 2020-06-20 HISTORY — DX: Dyslexia and alexia: R48.0

## 2020-06-20 HISTORY — DX: Anxiety disorder, unspecified: F41.9

## 2020-06-20 HISTORY — DX: Unspecified intracranial injury with loss of consciousness status unknown, initial encounter: S06.9XAA

## 2020-06-20 LAB — URINALYSIS, ROUTINE W REFLEX MICROSCOPIC
Bilirubin Urine: NEGATIVE
Hgb urine dipstick: NEGATIVE
Leukocytes,Ua: NEGATIVE
Nitrite: NEGATIVE
RBC / HPF: NONE SEEN (ref 0–?)
Specific Gravity, Urine: 1.025 (ref 1.000–1.030)
Total Protein, Urine: NEGATIVE
Urine Glucose: >=1000 — AB
Urobilinogen, UA: 0.2 (ref 0.0–1.0)
pH: 5.5 (ref 5.0–8.0)

## 2020-06-20 LAB — CBC WITH DIFFERENTIAL/PLATELET
Basophils Absolute: 0 10*3/uL (ref 0.0–0.1)
Basophils Relative: 0.7 % (ref 0.0–3.0)
Eosinophils Absolute: 0.1 10*3/uL (ref 0.0–0.7)
Eosinophils Relative: 2 % (ref 0.0–5.0)
HCT: 40.8 % (ref 39.0–52.0)
Hemoglobin: 13.9 g/dL (ref 13.0–17.0)
Lymphocytes Relative: 26 % (ref 12.0–46.0)
Lymphs Abs: 1.3 10*3/uL (ref 0.7–4.0)
MCHC: 34 g/dL (ref 30.0–36.0)
MCV: 88 fl (ref 78.0–100.0)
Monocytes Absolute: 0.4 10*3/uL (ref 0.1–1.0)
Monocytes Relative: 7.5 % (ref 3.0–12.0)
Neutro Abs: 3.2 10*3/uL (ref 1.4–7.7)
Neutrophils Relative %: 63.8 % (ref 43.0–77.0)
Platelets: 307 10*3/uL (ref 150.0–400.0)
RBC: 4.63 Mil/uL (ref 4.22–5.81)
RDW: 13.7 % (ref 11.5–15.5)
WBC: 5 10*3/uL (ref 4.0–10.5)

## 2020-06-20 LAB — LIPID PANEL
Cholesterol: 124 mg/dL (ref 0–200)
HDL: 42.7 mg/dL (ref >39.00)
LDL Cholesterol: 54 mg/dL (ref 0–99)
NonHDL: 81.49
Total CHOL/HDL Ratio: 3
Triglycerides: 138 mg/dL (ref 0.0–149.0)
VLDL: 27.6 mg/dL (ref 0.0–40.0)

## 2020-06-20 LAB — HEPATIC FUNCTION PANEL
ALT: 21 U/L (ref 0–53)
AST: 17 U/L (ref 0–37)
Albumin: 4.3 g/dL (ref 3.5–5.2)
Alkaline Phosphatase: 63 U/L (ref 39–117)
Bilirubin, Direct: 0.1 mg/dL (ref 0.0–0.3)
Total Bilirubin: 0.3 mg/dL (ref 0.2–1.2)
Total Protein: 6.9 g/dL (ref 6.0–8.3)

## 2020-06-20 LAB — BASIC METABOLIC PANEL
BUN: 17 mg/dL (ref 6–23)
CO2: 32 mEq/L (ref 19–32)
Calcium: 9.3 mg/dL (ref 8.4–10.5)
Chloride: 104 mEq/L (ref 96–112)
Creatinine, Ser: 1.1 mg/dL (ref 0.40–1.50)
GFR: 71.18 mL/min (ref >60.00)
Glucose, Bld: 147 mg/dL — ABNORMAL HIGH (ref 70–99)
Potassium: 4.2 mEq/L (ref 3.5–5.1)
Sodium: 140 mEq/L (ref 135–145)

## 2020-06-20 LAB — VITAMIN D 25 HYDROXY (VIT D DEFICIENCY, FRACTURES): VITD: 14.59 ng/mL — ABNORMAL LOW (ref 30.00–100.00)

## 2020-06-20 LAB — VITAMIN B12: Vitamin B-12: 239 pg/mL (ref 211–911)

## 2020-06-20 LAB — HEMOGLOBIN A1C: Hgb A1c MFr Bld: 7.5 % — ABNORMAL HIGH (ref 4.6–6.5)

## 2020-06-20 LAB — MICROALBUMIN / CREATININE URINE RATIO
Creatinine,U: 177.3 mg/dL
Microalb Creat Ratio: 0.7 mg/g (ref 0.0–30.0)
Microalb, Ur: 1.2 mg/dL (ref 0.0–1.9)

## 2020-06-20 LAB — TSH: TSH: 0.61 u[IU]/mL (ref 0.35–4.50)

## 2020-06-20 LAB — PSA: PSA: 2.17 ng/mL (ref 0.10–4.00)

## 2020-06-20 MED ORDER — BUPROPION HCL ER (XL) 150 MG PO TB24: 150.0000 mg | ORAL_TABLET | Freq: Every day | ORAL | 3 refills | Status: DC

## 2020-06-20 MED ORDER — TAMSULOSIN HCL 0.4 MG PO CAPS
0.4000 mg | ORAL_CAPSULE | Freq: Every day | ORAL | 3 refills | Status: DC
Start: 2020-06-20 — End: 2021-12-26

## 2020-06-20 NOTE — Progress Notes (Signed)
Established Patient Office Visit  Subjective:  Patient ID: Benjamin Serrano, male    DOB: [birth year]  Age: 65 y.o. MRN: [id number]       Chief Complaint:: wellness exam and Follow-up  DM, bph, smoking, anxiety       HPI:  Benjamin Serrano is a 65 y.o. male here for wellness exam with sister to assist with further PMH as detailed;  Overall doing ok without specific complaint; Not taking the flomax recently, has plans to f/u with urology soon, sister plans to do better with helping him taking meds and keep appts Has weak stream, but.Denies urinary symptoms such as dysuria, frequency, urgency, flank pain, hematuria or n/v, fever, chills.   Pt denies polydipsia, polyuria, but wt has been up and down depending on diet.  Conts to smoke, no plans to try to stop.  Denies worsening depressive symptoms, suicidal ideation, or panic; has ongoing anxiety, maybe worse recently and is asking for some treatment, but declines counseling referral.     Wt Readings from Last 3 Encounters:  06/20/20 211 lb (95.7 kg)  12/15/19 197 lb (89.4 kg)  06/05/19 209 lb 3.2 oz (94.9 kg)   BP Readings from Last 3 Encounters:  06/20/20 136/70  12/15/19 (!) 140/80  06/05/19 130/80   Immunization History  Administered Date(s) Administered  . Influenza,inj,Quad PF,6+ Mos 05/30/2018, 06/20/2020  . PFIZER(Purple Top)SARS-COV-2 Vaccination 09/04/2019, 09/20/2019, 04/22/2020  . Pneumococcal Polysaccharide-23 11/29/2017  . Tdap 11/29/2017  There are no preventive care reminders to display for this patient.    Past Medical History:  Diagnosis Date  . Anxiety 06/20/2020  . Aortic atherosclerosis ([place]) 06/20/2020  . Asthma   . BPH (benign prostatic hyperplasia) 06/20/2020  . Depression   . Diabetes mellitus without complication ([place])   . Dyslexia 06/20/2020  . Essential hypertension 09/23/2016  . Hyperlipemia   . Hyperlipidemia 09/23/2016  . Hypertension   . Poor short-term memory 06/20/2020  . Traumatic brain injury ([place])  06/20/2020  . Type 2 diabetes mellitus ([place]) 09/23/2016   Past Surgical History:  Procedure Laterality Date  . Arm surgery Right   . LEG SURGERY      reports that he has been smoking cigarettes. He has a 86.00 pack-year smoking history. He has never used smokeless tobacco. He reports current alcohol use. He reports that he does not use drugs. family history includes Alzheimer's disease in his maternal grandmother; Diabetes in his mother; Hypertension in his maternal grandmother. No Known Allergies Current Outpatient Medications on File Prior to Visit  Medication Sig Dispense Refill  . amLODipine (NORVASC) 10 MG tablet Take 1 tablet by mouth once daily 90 tablet 0  . aspirin EC 81 MG tablet Take 81 mg by mouth daily.    . Blood Glucose Monitoring Suppl (ONE TOUCH ULTRA 2) w/Device KIT Use to check blood sugars twice a day 1 each 0  . finasteride (PROSCAR) 5 MG tablet Take 5 mg by mouth daily.    Marland Kitchen glipiZIDE (GLUCOTROL) 5 MG tablet Take 1 tablet by mouth once daily 90 tablet 0  . glucose blood (ONETOUCH ULTRA) test strip USE 1 STRIP TO CHECK GLUCOSE TWICE DAILY 100 each 6  . metFORMIN (GLUCOPHAGE) 1000 MG tablet TAKE 1 TABLET BY MOUTH TWICE DAILY WITH A MEAL 180 tablet 0  . ONETOUCH DELICA LANCETS 44H MISC Use to check blood sugars 2 times daily 100 each 5  . rosuvastatin (CRESTOR) 20 MG tablet Take 1 tablet (20 mg total) by mouth daily.  90 tablet 0   No current facility-administered medications on file prior to visit.        ROS:  All others reviewed and negative.  Objective        PE:  BP 136/70   Pulse 70   Temp 98.1 F (36.7 C) (Oral)   Ht '5\' 8"'  (1.727 m)   Wt 211 lb (95.7 kg)   SpO2 96%   BMI 32.08 kg/m                 Constitutional: Pt appears in NAD               HENT: Head: NCAT.                Right Ear: External ear normal.                 Left Ear: External ear normal.                Eyes: . Pupils are equal, round, and reactive to light. Conjunctivae and EOM are  normal               Nose: without d/c or deformity               Neck: Neck supple. Gross normal ROM               Cardiovascular: Normal rate and regular rhythm.                 Pulmonary/Chest: Effort normal and breath sounds without rales or wheezing.                Abd:  Soft, NT, ND, + BS, no organomegaly               Neurological: Pt is alert. At baseline orientation, motor grossly intact               Skin: Skin is warm. No rashes, no other new lesions, LE edema - none               Psychiatric: Pt behavior is normal without agitation   Micro: none  Cardiac tracings I have personally interpreted today:  none  Pertinent Radiological findings (summarize): none   Lab Results  Component Value Date   WBC 5.0 06/20/2020   HGB 13.9 06/20/2020   HCT 40.8 06/20/2020   PLT 307.0 06/20/2020   GLUCOSE 147 (H) 06/20/2020   CHOL 124 06/20/2020   TRIG 138.0 06/20/2020   HDL 42.70 06/20/2020   LDLCALC 54 06/20/2020   ALT 21 06/20/2020   AST 17 06/20/2020   NA 140 06/20/2020   K 4.2 06/20/2020   CL 104 06/20/2020   CREATININE 1.10 06/20/2020   BUN 17 06/20/2020   CO2 32 06/20/2020   TSH 0.61 06/20/2020   PSA 2.17 06/20/2020   HGBA1C 7.5 (H) 06/20/2020   MICROALBUR 1.2 06/20/2020   Assessment/Plan:  Benjamin Serrano is a 65 y.o. Black or African American [2] male with  has a past medical history of Anxiety (06/20/2020), Aortic atherosclerosis ([place]) (06/20/2020), Asthma, BPH (benign prostatic hyperplasia) (06/20/2020), Depression, Diabetes mellitus without complication ([place]), Dyslexia (06/20/2020), Essential hypertension (09/23/2016), Hyperlipemia, Hyperlipidemia (09/23/2016), Hypertension, Poor short-term memory (06/20/2020), Traumatic brain injury ([place]) (06/20/2020), and Type 2 diabetes mellitus ([place]) (09/23/2016). Anxiety Pt will accept hydroxyzine prn , delcines referral counseling  Depression Mild, denies SI or HI, declines SSRI due to wary of side effects, will try wellbutrin SXL  150 qd in  light of smoking and anxiety as well  BPH (benign prostatic hyperplasia) With mild symptoms, for restart flomax, and f/u urology as planned  Encounter for well adult exam with abnormal findings Age and sex appropriate education and counseling updated with regular exercise and diet Referrals for preventative services - none needed Immunizations addressed  - fro flu shot today Smoking counseling  - counseled to quit Evidence for depression or other mood disorder - none significant Most recent labs reviewed. I have personally reviewed and have noted: 1) the patient's medical and social history 2) The patient's current medications and supplements 3) The patient's height, weight, and BMI have been recorded in the chart   Aortic atherosclerosis ([place]) To continue diet, crestor 20 mg,   Essential hypertension BP Readings from Last 3 Encounters:  06/20/20 136/70  12/15/19 (!) 140/80  06/05/19 130/80   Stable, pt to continue medical treatment  - amlodipine  Current Outpatient Medications (Endocrine & Metabolic):  .  glipiZIDE (GLUCOTROL) 5 MG tablet, Take 1 tablet by mouth once daily .  metFORMIN (GLUCOPHAGE) 1000 MG tablet, TAKE 1 TABLET BY MOUTH TWICE DAILY WITH A MEAL  Current Outpatient Medications (Cardiovascular):  .  amLODipine (NORVASC) 10 MG tablet, Take 1 tablet by mouth once daily .  rosuvastatin (CRESTOR) 20 MG tablet, Take 1 tablet (20 mg total) by mouth daily.   Current Outpatient Medications (Analgesics):  .  aspirin EC 81 MG tablet, Take 81 mg by mouth daily.   Current Outpatient Medications (Other):  .  Blood Glucose Monitoring Suppl (ONE TOUCH ULTRA 2) w/Device KIT, Use to check blood sugars twice a day .  buPROPion (WELLBUTRIN XL) 150 MG 24 hr tablet, Take 1 tablet (150 mg total) by mouth daily. .  finasteride (PROSCAR) 5 MG tablet, Take 5 mg by mouth daily. Marland Kitchen  glucose blood (ONETOUCH ULTRA) test strip, USE 1 STRIP TO CHECK GLUCOSE TWICE DAILY .  ONETOUCH  DELICA LANCETS 91P MISC, Use to check blood sugars 2 times daily .  tamsulosin (FLOMAX) 0.4 MG CAPS capsule, Take 1 capsule (0.4 mg total) by mouth daily. Annual appt due in July must see provider for future refills   Hyperlipidemia Lab Results  Component Value Date   [place] 54 06/20/2020   Stable, pt to continue current statin crestor 20   Tobacco abuse Counseled to quit  Type 2 diabetes mellitus ([place]) Lab Results  Component Value Date   HGBA1C 7.5 (H) 06/20/2020   Stable, pt to continue current medical treatment glucotrol, metformin   Followup: Return in about 6 months (around 12/18/2020).  Cathlean Cower, MD 06/23/2020 6:18 PM [facility]

## 2020-06-20 NOTE — Assessment & Plan Note (Signed)
With mild symptoms, for restart flomax, and f/u urology as planned

## 2020-06-20 NOTE — Assessment & Plan Note (Addendum)
Mild, denies SI or HI, declines SSRI due to wary of side effects, will try wellbutrin SXL 150 qd in light of smoking and anxiety as well

## 2020-06-20 NOTE — Patient Instructions (Addendum)
OK to take the flomax, and no need to take the uroxatral.    You had the flu shot today  Please take all new medication as prescribed - the wellbutrin XL 150 mg - 1 per day  Please stop smoking  Please continue all other medications as before, and refills have been done if requested.  Please have the pharmacy call with any other refills you may need.  Please continue your efforts at being more active, low cholesterol diet, and weight control.  You are otherwise up to date with prevention measures today.  Please keep your appointments with your specialists as you may have planned  Please go to the LAB at the blood drawing area for the tests to be done  You will be contacted by phone if any changes need to be made immediately.  Otherwise, you will receive a letter about your results with an explanation, but please check with MyChart first.  Please remember to sign up for MyChart if you have not done so, as this will be important to you in the future with finding out test results, communicating by private email, and scheduling acute appointments online when needed.  Please make an Appointment to return in 6 months, or sooner if needed

## 2020-06-20 NOTE — Assessment & Plan Note (Signed)
Pt will accept hydroxyzine prn , delcines referral counseling

## 2020-06-23 ENCOUNTER — Encounter: Payer: Self-pay | Admitting: Internal Medicine

## 2020-06-23 NOTE — Assessment & Plan Note (Signed)
Lab Results  Component Value Date   LDLCALC 54 06/20/2020   Stable, pt to continue current statin crestor 20

## 2020-06-23 NOTE — Assessment & Plan Note (Signed)
BP Readings from Last 3 Encounters:  06/20/20 136/70  12/15/19 (!) 140/80  06/05/19 130/80   Stable, pt to continue medical treatment  - amlodipine  Current Outpatient Medications (Endocrine & Metabolic):  .  glipiZIDE (GLUCOTROL) 5 MG tablet, Take 1 tablet by mouth once daily .  metFORMIN (GLUCOPHAGE) 1000 MG tablet, TAKE 1 TABLET BY MOUTH TWICE DAILY WITH A MEAL  Current Outpatient Medications (Cardiovascular):  .  amLODipine (NORVASC) 10 MG tablet, Take 1 tablet by mouth once daily .  rosuvastatin (CRESTOR) 20 MG tablet, Take 1 tablet (20 mg total) by mouth daily.   Current Outpatient Medications (Analgesics):  .  aspirin EC 81 MG tablet, Take 81 mg by mouth daily.   Current Outpatient Medications (Other):  .  Blood Glucose Monitoring Suppl (ONE TOUCH ULTRA 2) w/Device KIT, Use to check blood sugars twice a day .  buPROPion (WELLBUTRIN XL) 150 MG 24 hr tablet, Take 1 tablet (150 mg total) by mouth daily. .  finasteride (PROSCAR) 5 MG tablet, Take 5 mg by mouth daily. Marland Kitchen  glucose blood (ONETOUCH ULTRA) test strip, USE 1 STRIP TO CHECK GLUCOSE TWICE DAILY .  ONETOUCH DELICA LANCETS 27C MISC, Use to check blood sugars 2 times daily .  tamsulosin (FLOMAX) 0.4 MG CAPS capsule, Take 1 capsule (0.4 mg total) by mouth daily. Annual appt due in July must see provider for future refills

## 2020-06-23 NOTE — Assessment & Plan Note (Signed)
To continue diet, crestor 20 mg,

## 2020-06-23 NOTE — Assessment & Plan Note (Signed)
Counseled to quit 

## 2020-06-23 NOTE — Assessment & Plan Note (Signed)
Lab Results  Component Value Date   HGBA1C 7.5 (H) 06/20/2020   Stable, pt to continue current medical treatment glucotrol, metformin

## 2020-06-23 NOTE — Assessment & Plan Note (Signed)
Age and sex appropriate education and counseling updated with regular exercise and diet Referrals for preventative services - none needed Immunizations addressed  - fro flu shot today Smoking counseling  - counseled to quit Evidence for depression or other mood disorder - none significant Most recent labs reviewed. I have personally reviewed and have noted: 1) the patient's medical and social history 2) The patient's current medications and supplements 3) The patient's height, weight, and BMI have been recorded in the chart

## 2020-06-25 ENCOUNTER — Other Ambulatory Visit: Payer: Self-pay | Admitting: Internal Medicine

## 2020-06-25 NOTE — Telephone Encounter (Signed)
Please refill as per office routine med refill policy (all routine meds refilled for 3 mo or monthly per pt preference up to one year from last visit, then month to month grace period for 3 mo, then further med refills will have to be denied)  

## 2020-07-03 ENCOUNTER — Telehealth: Payer: Self-pay | Admitting: Internal Medicine

## 2020-07-03 NOTE — Telephone Encounter (Signed)
    Patient requesting refill for glucose blood (ONETOUCH ULTRA) test strip Pharmacy  [facility] ([place]), [place] - [address] Phone:  [phone]

## 2020-07-04 ENCOUNTER — Other Ambulatory Visit: Payer: Self-pay

## 2020-07-04 DIAGNOSIS — E119 Type 2 diabetes mellitus without complications: Secondary | ICD-10-CM

## 2020-07-04 MED ORDER — ONETOUCH ULTRA VI STRP: ORAL_STRIP | 6 refills | Status: DC

## 2020-07-04 NOTE — Telephone Encounter (Signed)
Patient calling back checking back on that status because he is out

## 2020-07-04 NOTE — Telephone Encounter (Signed)
Return patient call verified with pharmacy last fill on test strips was in 12/21 went ahead an refilled it .

## 2020-08-30 ENCOUNTER — Other Ambulatory Visit: Payer: Self-pay | Admitting: Internal Medicine

## 2020-08-30 DIAGNOSIS — E119 Type 2 diabetes mellitus without complications: Secondary | ICD-10-CM

## 2020-08-30 NOTE — Telephone Encounter (Signed)
Please refill as per office routine med refill policy (all routine meds refilled for 3 mo or monthly per pt preference up to one year from last visit, then month to month grace period for 3 mo, then further med refills will have to be denied)  

## 2020-09-03 ENCOUNTER — Other Ambulatory Visit: Payer: Self-pay

## 2020-09-16 ENCOUNTER — Other Ambulatory Visit: Payer: Self-pay | Admitting: Internal Medicine

## 2020-09-16 DIAGNOSIS — I10 Essential (primary) hypertension: Secondary | ICD-10-CM

## 2020-09-16 NOTE — Telephone Encounter (Signed)
Please refill as per office routine med refill policy (all routine meds refilled for 3 mo or monthly per pt preference up to one year from last visit, then month to month grace period for 3 mo, then further med refills will have to be denied)  

## 2020-10-02 ENCOUNTER — Other Ambulatory Visit: Payer: Self-pay | Admitting: Urology

## 2020-10-06 ENCOUNTER — Other Ambulatory Visit: Payer: Self-pay | Admitting: Urology

## 2020-10-17 DIAGNOSIS — H04123 Dry eye syndrome of bilateral lacrimal glands: Secondary | ICD-10-CM | POA: Diagnosis not present

## 2020-10-17 DIAGNOSIS — H52202 Unspecified astigmatism, left eye: Secondary | ICD-10-CM | POA: Diagnosis not present

## 2020-10-17 DIAGNOSIS — H524 Presbyopia: Secondary | ICD-10-CM | POA: Diagnosis not present

## 2020-10-17 DIAGNOSIS — H40013 Open angle with borderline findings, low risk, bilateral: Secondary | ICD-10-CM | POA: Diagnosis not present

## 2020-10-17 DIAGNOSIS — H5213 Myopia, bilateral: Secondary | ICD-10-CM | POA: Diagnosis not present

## 2020-10-17 DIAGNOSIS — H2513 Age-related nuclear cataract, bilateral: Secondary | ICD-10-CM | POA: Diagnosis not present

## 2020-10-17 DIAGNOSIS — E119 Type 2 diabetes mellitus without complications: Secondary | ICD-10-CM | POA: Diagnosis not present

## 2020-11-14 DIAGNOSIS — R3912 Poor urinary stream: Secondary | ICD-10-CM | POA: Diagnosis not present

## 2020-12-04 ENCOUNTER — Other Ambulatory Visit: Payer: Self-pay | Admitting: Internal Medicine

## 2020-12-04 DIAGNOSIS — E119 Type 2 diabetes mellitus without complications: Secondary | ICD-10-CM

## 2020-12-04 NOTE — Telephone Encounter (Signed)
Please refill as per office routine med refill policy (all routine meds refilled for 3 mo or monthly per pt preference up to one year from last visit, then month to month grace period for 3 mo, then further med refills will have to be denied)  

## 2020-12-20 ENCOUNTER — Ambulatory Visit: Payer: Medicare Other

## 2020-12-20 ENCOUNTER — Ambulatory Visit (INDEPENDENT_AMBULATORY_CARE_PROVIDER_SITE_OTHER): Payer: Medicare Other | Admitting: Internal Medicine

## 2020-12-20 ENCOUNTER — Other Ambulatory Visit: Payer: Self-pay

## 2020-12-20 ENCOUNTER — Encounter: Payer: Self-pay | Admitting: Internal Medicine

## 2020-12-20 VITALS — BP 148/82 | HR 78 | Temp 98.5°F | Ht 68.0 in | Wt 194.0 lb

## 2020-12-20 DIAGNOSIS — I10 Essential (primary) hypertension: Secondary | ICD-10-CM | POA: Diagnosis not present

## 2020-12-20 DIAGNOSIS — Z720 Tobacco use: Secondary | ICD-10-CM | POA: Diagnosis not present

## 2020-12-20 DIAGNOSIS — E782 Mixed hyperlipidemia: Secondary | ICD-10-CM

## 2020-12-20 DIAGNOSIS — E1165 Type 2 diabetes mellitus with hyperglycemia: Secondary | ICD-10-CM | POA: Diagnosis not present

## 2020-12-20 DIAGNOSIS — L84 Corns and callosities: Secondary | ICD-10-CM | POA: Insufficient documentation

## 2020-12-20 DIAGNOSIS — E559 Vitamin D deficiency, unspecified: Secondary | ICD-10-CM

## 2020-12-20 LAB — LIPID PANEL
Cholesterol: 122 mg/dL (ref 0–200)
HDL: 49.6 mg/dL (ref >39.00)
LDL Cholesterol: 54 mg/dL (ref 0–99)
NonHDL: 72.88
Total CHOL/HDL Ratio: 2
Triglycerides: 93 mg/dL (ref 0.0–149.0)
VLDL: 18.6 mg/dL (ref 0.0–40.0)

## 2020-12-20 LAB — HEPATIC FUNCTION PANEL
ALT: 30 U/L (ref 0–53)
AST: 21 U/L (ref 0–37)
Albumin: 4.4 g/dL (ref 3.5–5.2)
Alkaline Phosphatase: 60 U/L (ref 39–117)
Bilirubin, Direct: 0 mg/dL (ref 0.0–0.3)
Total Bilirubin: 0.3 mg/dL (ref 0.2–1.2)
Total Protein: 7.6 g/dL (ref 6.0–8.3)

## 2020-12-20 LAB — VITAMIN D 25 HYDROXY (VIT D DEFICIENCY, FRACTURES): VITD: 53.7 ng/mL (ref 30.00–100.00)

## 2020-12-20 LAB — BASIC METABOLIC PANEL
BUN: 22 mg/dL (ref 6–23)
CO2: 28 mEq/L (ref 19–32)
Calcium: 9.9 mg/dL (ref 8.4–10.5)
Chloride: 105 mEq/L (ref 96–112)
Creatinine, Ser: 1.33 mg/dL (ref 0.40–1.50)
GFR: 56.48 mL/min — ABNORMAL LOW (ref >60.00)
Glucose, Bld: 142 mg/dL — ABNORMAL HIGH (ref 70–99)
Potassium: 4.9 mEq/L (ref 3.5–5.1)
Sodium: 140 mEq/L (ref 135–145)

## 2020-12-20 LAB — HEMOGLOBIN A1C: Hgb A1c MFr Bld: 7.5 % — ABNORMAL HIGH (ref 4.6–6.5)

## 2020-12-20 NOTE — Patient Instructions (Signed)
You will be contacted regarding the referral for: podiatry  Please take OTC Vitamin D3 at 2000 units per day, indefinitely  Please continue all other medications as before, and refills have been done if requested.  Please have the pharmacy call with any other refills you may need.  Please continue your efforts at being more active, low cholesterol diet, and weight control.  Please keep your appointments with your specialists as you may have planned  Please go to the LAB at the blood drawing area for the tests to be done  You will be contacted by phone if any changes need to be made immediately.  Otherwise, you will receive a letter about your results with an explanation, but please check with MyChart first.  Please remember to sign up for MyChart if you have not done so, as this will be important to you in the future with finding out test results, communicating by private email, and scheduling acute appointments online when needed.  Please make an Appointment to return in 6 months, or sooner if needed

## 2020-12-20 NOTE — Progress Notes (Signed)
Patient ID: Benjamin Serrano, male   DOB: 07-13-1955, 65 y.o.   MRN: [id number]        Chief Complaint: follow up HTN, HLD and hyperglycemia, left foot corn, recent wt loss        HPI:  Benjamin Serrano is a 65 y.o. male here overall doing ok, Pt denies chest pain, increased sob or doe, wheezing, orthopnea, PND, increased LE swelling, palpitations, dizziness or syncope.   Pt denies polydipsia, polyuria, or new focal neuro s/s.   Pt denies fever, wt loss, night sweats, loss of appetite, or other constitutional symptoms  Has a worsening 2 mo painful corn to the left foot that needs attention.  Has has significant wt loss but wt is up and down, always intentional.  BP has been < 140/90 at home         Wt Readings from Last 3 Encounters:  12/20/20 194 lb (88 kg)  06/20/20 211 lb (95.7 kg)  12/15/19 197 lb (89.4 kg)   BP Readings from Last 3 Encounters:  12/20/20 (!) 148/82  06/20/20 136/70  12/15/19 (!) 140/80         Past Medical History:  Diagnosis Date   Anxiety 06/20/2020   Aortic atherosclerosis ([place]) 06/20/2020   Asthma    BPH (benign prostatic hyperplasia) 06/20/2020   Depression    Diabetes mellitus without complication ([place])    Dyslexia 06/20/2020   Essential hypertension 09/23/2016   Hyperlipemia    Hyperlipidemia 09/23/2016   Hypertension    Poor short-term memory 06/20/2020   Traumatic brain injury ([place]) 06/20/2020   Type 2 diabetes mellitus ([place]) 09/23/2016   Past Surgical History:  Procedure Laterality Date   Arm surgery Right    LEG SURGERY      reports that he has been smoking cigarettes. He has a 86.00 pack-year smoking history. He has never used smokeless tobacco. He reports current alcohol use. He reports that he does not use drugs. family history includes Alzheimer's disease in his maternal grandmother; Diabetes in his mother; Hypertension in his maternal grandmother. No Known Allergies Current Outpatient Medications on File Prior to Visit  Medication Sig Dispense Refill    alfuzosin (UROXATRAL) 10 MG 24 hr tablet TAKE 1 TABLET BY MOUTH EVERY DAY AT BEDTIME 90 tablet 0   amLODipine (NORVASC) 10 MG tablet Take 1 tablet by mouth once daily 90 tablet 1   aspirin EC 81 MG tablet Take 81 mg by mouth daily.     Blood Glucose Monitoring Suppl (ONE TOUCH ULTRA 2) w/Device KIT Use to check blood sugars twice a day 1 each 0   buPROPion (WELLBUTRIN XL) 150 MG 24 hr tablet Take 1 tablet (150 mg total) by mouth daily. 90 tablet 3   finasteride (PROSCAR) 5 MG tablet Take 5 mg by mouth daily.     glipiZIDE (GLUCOTROL) 5 MG tablet Take 1 tablet by mouth once daily 30 tablet 0   glucose blood (ONETOUCH ULTRA) test strip USE 1 STRIP TO CHECK GLUCOSE TWICE DAILY 100 each 6   metFORMIN (GLUCOPHAGE) 1000 MG tablet TAKE 1 TABLET BY MOUTH TWICE DAILY WITH A MEAL 60 tablet 0   ONETOUCH DELICA LANCETS 26E MISC Use to check blood sugars 2 times daily 100 each 5   rosuvastatin (CRESTOR) 20 MG tablet Take 1 tablet by mouth once daily 90 tablet 1   tamsulosin (FLOMAX) 0.4 MG CAPS capsule Take 1 capsule (0.4 mg total) by mouth daily. Annual appt due in July must see  provider for future refills 90 capsule 3   No current facility-administered medications on file prior to visit.        ROS:  All others reviewed and negative.  Objective        PE:  BP (!) 148/82 (BP Location: Left Arm, Patient Position: Sitting, Cuff Size: Normal)   Pulse 78   Temp 98.5 F (36.9 C) (Oral)   Ht _0  (1.727 m)   Wt 194 lb (88 kg)   SpO2 97%   BMI 29.50 kg/m                 Constitutional: Pt appears in NAD               HENT: Head: NCAT.                Right Ear: External ear normal.                 Left Ear: External ear normal.                Eyes: . Pupils are equal, round, and reactive to light. Conjunctivae and EOM are normal               Nose: without d/c or deformity               Neck: Neck supple. Gross normal ROM               Cardiovascular: Normal rate and regular rhythm.                  Pulmonary/Chest: Effort normal and breath sounds without rales or wheezing.                Abd:  Soft, NT, ND, + BS, no organomegaly               Neurological: Pt is alert. At baseline orientation, motor grossly intact               Skin: Skin is warm. No rashes, no other new lesions, LE edema - none               Psychiatric: Pt behavior is normal without agitation   Micro: none  Cardiac tracings I have personally interpreted today:  none  Pertinent Radiological findings (summarize): none   Lab Results  Component Value Date   WBC 5.0 06/20/2020   HGB 13.9 06/20/2020   HCT 40.8 06/20/2020   PLT 307.0 06/20/2020   GLUCOSE 142 (H) 12/20/2020   CHOL 122 12/20/2020   TRIG 93.0 12/20/2020   HDL 49.60 12/20/2020   LDLCALC 54 12/20/2020   ALT 30 12/20/2020   AST 21 12/20/2020   NA 140 12/20/2020   K 4.9 12/20/2020   CL 105 12/20/2020   CREATININE 1.33 12/20/2020   BUN 22 12/20/2020   CO2 28 12/20/2020   TSH 0.61 06/20/2020   PSA 2.17 06/20/2020   HGBA1C 7.5 (H) 12/20/2020   MICROALBUR 1.2 06/20/2020   Assessment/Plan:  Benjamin Serrano is a 65 y.o. Black or African American [2] male with  has a past medical history of Anxiety (06/20/2020), Aortic atherosclerosis ([place]) (06/20/2020), Asthma, BPH (benign prostatic hyperplasia) (06/20/2020), Depression, Diabetes mellitus without complication ([place]), Dyslexia (06/20/2020), Essential hypertension (09/23/2016), Hyperlipemia, Hyperlipidemia (09/23/2016), Hypertension, Poor short-term memory (06/20/2020), Traumatic brain injury ([place]) (06/20/2020), and Type 2 diabetes mellitus ([place]) (09/23/2016).  Tobacco abuse Counseled to quit, pt not ready  Type 2 diabetes mellitus (  [place]) Lab Results  Component Value Date   HGBA1C 7.5 (H) 12/20/2020   Uncontrolled, goal a1c < 7, will need to increase the glucotrol er to 10 qd   Hyperlipidemia Lab Results  Component Value Date   LDLCALC 54 12/20/2020   Stable, pt to continue current statin crestor  20   Essential hypertension BP Readings from Last 3 Encounters:  12/20/20 (!) 148/82  06/20/20 136/70  12/15/19 (!) 140/80   Uncontrolled here, pt states ok and < 140/90 at home,  pt to continue medical treatment norvasc   Corn of foot Recent worsening pain in last 2-3 mo, for podiatry referral  Vitamin D deficiency Last vitamin D Lab Results  Component Value Date   VD25OH 53.70 12/20/2020   Stable, cont oral replacement  Followup: Return in about 6 months (around 06/22/2021).  Cathlean Cower, MD 12/21/2020 4:48 PM [facility]

## 2020-12-21 ENCOUNTER — Encounter: Payer: Self-pay | Admitting: Internal Medicine

## 2020-12-21 DIAGNOSIS — E559 Vitamin D deficiency, unspecified: Secondary | ICD-10-CM | POA: Insufficient documentation

## 2020-12-21 NOTE — Assessment & Plan Note (Signed)
Counseled to quit, pt not ready 

## 2020-12-21 NOTE — Assessment & Plan Note (Addendum)
Lab Results  Component Value Date   HGBA1C 7.5 (H) 12/20/2020   Uncontrolled, goal a1c < 7, will need to increase the glucotrol er to 10 qd

## 2020-12-21 NOTE — Assessment & Plan Note (Signed)
Last vitamin D Lab Results  Component Value Date   VD25OH 53.70 12/20/2020   Stable, cont oral replacement

## 2020-12-21 NOTE — Assessment & Plan Note (Signed)
Lab Results  Component Value Date   LDLCALC 54 12/20/2020   Stable, pt to continue current statin crestor 20

## 2020-12-21 NOTE — Assessment & Plan Note (Signed)
BP Readings from Last 3 Encounters:  12/20/20 (!) 148/82  06/20/20 136/70  12/15/19 (!) 140/80   Uncontrolled here, pt states ok and < 140/90 at home,  pt to continue medical treatment norvasc

## 2020-12-21 NOTE — Assessment & Plan Note (Signed)
Recent worsening pain in last 2-3 mo, for podiatry referral

## 2020-12-22 ENCOUNTER — Encounter: Payer: Self-pay | Admitting: Internal Medicine

## 2020-12-22 ENCOUNTER — Other Ambulatory Visit: Payer: Self-pay | Admitting: Internal Medicine

## 2020-12-22 MED ORDER — GLIPIZIDE ER 10 MG PO TB24: 10.0000 mg | ORAL_TABLET | Freq: Every day | ORAL | 3 refills | Status: DC

## 2021-01-11 ENCOUNTER — Other Ambulatory Visit: Payer: Self-pay | Admitting: Internal Medicine

## 2021-01-11 DIAGNOSIS — E119 Type 2 diabetes mellitus without complications: Secondary | ICD-10-CM

## 2021-01-11 NOTE — Telephone Encounter (Signed)
Please refill as per office routine med refill policy (all routine meds to be refilled for 3 mo or monthly (per pt preference) up to one year from last visit, then month to month grace period for 3 mo, then further med refills will have to be denied) ? ?

## 2021-01-21 ENCOUNTER — Ambulatory Visit (INDEPENDENT_AMBULATORY_CARE_PROVIDER_SITE_OTHER): Payer: Medicare Other | Admitting: *Deleted

## 2021-01-21 DIAGNOSIS — Z0000 Encounter for general adult medical examination without abnormal findings: Secondary | ICD-10-CM

## 2021-01-21 NOTE — Progress Notes (Signed)
Subjective:   Benjamin Serrano is a 65 y.o. male who presents for Medicare Annual/Subsequent preventive examination.  I connected with  Benjamin Serrano on 01/21/21 by audio enabled telemedicine application and verified that I am speaking with the correct person using two identifiers.   I discussed the limitations of evaluation and management by telemedicine. The patient expressed understanding and agreed to proceed.   Location of Patient: Home Location of Provider: Office Persons participating in visit: Benjamin Serrano (patient) & Benjamin Serrano, CMA  Review of Systems    Defer to PCP     Objective:    Today's Vitals   01/21/21 1746  PainSc: 6    There is no height or weight on file to calculate BMI.  Advanced Directives 01/21/2021 12/13/2018 11/29/2017  Does Patient Have a Medical Advance Directive? No No No  Would patient like information on creating a medical advance directive? Yes (ED - Information included in AVS) No - Patient declined Yes (ED - Information included in AVS)    Current Medications (verified) Outpatient Encounter Medications as of 01/21/2021  Medication Sig   alfuzosin (UROXATRAL) 10 MG 24 hr tablet TAKE 1 TABLET BY MOUTH EVERY DAY AT BEDTIME   amLODipine (NORVASC) 10 MG tablet Take 1 tablet by mouth once daily   aspirin EC 81 MG tablet Take 81 mg by mouth daily.   Blood Glucose Monitoring Suppl (ONE TOUCH ULTRA 2) w/Device KIT Use to check blood sugars twice a day   buPROPion (WELLBUTRIN XL) 150 MG 24 hr tablet Take 1 tablet (150 mg total) by mouth daily.   finasteride (PROSCAR) 5 MG tablet Take 5 mg by mouth daily.   glipiZIDE (GLUCOTROL XL) 10 MG 24 hr tablet Take 1 tablet (10 mg total) by mouth daily with breakfast.   glucose blood (ONETOUCH ULTRA) test strip USE 1 STRIP TO CHECK GLUCOSE TWICE DAILY   metFORMIN (GLUCOPHAGE) 1000 MG tablet TAKE 1 TABLET BY MOUTH TWICE DAILY WITH A MEAL   ONETOUCH DELICA LANCETS 45Y MISC Use to check blood sugars 2 times daily    rosuvastatin (CRESTOR) 20 MG tablet Take 1 tablet by mouth once daily   tamsulosin (FLOMAX) 0.4 MG CAPS capsule Take 1 capsule (0.4 mg total) by mouth daily. Annual appt due in July must see provider for future refills   No facility-administered encounter medications on file as of 01/21/2021.    Allergies (verified) Patient has no known allergies.   History: Past Medical History:  Diagnosis Date   Anxiety 06/20/2020   Aortic atherosclerosis ([place]) 06/20/2020   Asthma    BPH (benign prostatic hyperplasia) 06/20/2020   Depression    Diabetes mellitus without complication ([place])    Dyslexia 06/20/2020   Essential hypertension 09/23/2016   Hyperlipemia    Hyperlipidemia 09/23/2016   Hypertension    Poor short-term memory 06/20/2020   Traumatic brain injury ([place]) 06/20/2020   Type 2 diabetes mellitus ([place]) 09/23/2016   Past Surgical History:  Procedure Laterality Date   Arm surgery Right    LEG SURGERY     Family History  Problem Relation Age of Onset   Diabetes Mother    Alzheimer's disease Maternal Grandmother    Hypertension Maternal Grandmother    Colon cancer Neg Hx    Colon polyps Neg Hx    Social History   Socioeconomic History   Marital status: Widowed    Spouse name: Not on file   Number of children: 1   Years of education: 6  Highest education level: Not on file  Occupational History   Occupation: Disability    Comment: Mental health  Tobacco Use   Smoking status: Every Day    Packs/day: 2.00    Years: 43.00    Pack years: 86.00    Types: Cigarettes   Smokeless tobacco: Never  Vaping Use   Vaping Use: Never used  Substance and Sexual Activity   Alcohol use: Yes    Comment: social   Drug use: No   Sexual activity: Not Currently  Other Topics Concern   Not on file  Social History Narrative   Fun/Hobby: Scientist, research (life sciences) and art.    Social Determinants of Health   Financial Resource Strain: Low Risk    Difficulty of Paying Living Expenses: Not hard at all  Food Insecurity:  No Food Insecurity   Worried About Charity fundraiser in the Last Year: Never true   [place] in the Last Year: Never true  Transportation Needs: No Transportation Needs   Lack of Transportation (Medical): No   Lack of Transportation (Non-Medical): No  Physical Activity: Sufficiently Active   Days of Exercise per Week: 7 days   Minutes of Exercise per Session: 30 min  Stress: Stress Concern Present   Feeling of Stress : Very much  Social Connections: Moderately Integrated   Frequency of Communication with Friends and Family: Twice a week   Frequency of Social Gatherings with Friends and Family: Once a week   Attends Religious Services: 1 to 4 times per year   Active Member of [employer] or Organizations: Yes   Attends Archivist Meetings: Never   Marital Status: Widowed    Tobacco Counseling Ready to quit: Not Answered Counseling given: Not Answered   Clinical Intake:     Pain : 0-10 Pain Score: 6  Pain Type: Chronic pain Pain Location: Back Pain Onset: More than a month ago Pain Frequency: Intermittent     Diabetes: Yes CBG done?: No Did pt. bring in CBG monitor from home?: No  How often do you need to have someone help you when you read instructions, pamphlets, or other written materials from your doctor or pharmacy?: 1 - Never  Diabetic? Yes   Interpreter Needed?: No      Activities of Daily Living In your present state of health, do you have any difficulty performing the following activities: 01/21/2021  Hearing? N  Vision? N  Difficulty concentrating or making decisions? Y  Walking or climbing stairs? N  Dressing or bathing? N  Doing errands, shopping? N  Preparing Food and eating ? N  Using the Toilet? N  In the past six months, have you accidently leaked urine? N  Do you have problems with loss of bowel control? N  Managing your Medications? N  Managing your Finances? N  Housekeeping or managing your Housekeeping? N  Some recent  data might be hidden    Patient Care Team: Biagio Borg, MD as PCP - General (Internal Medicine) Warden Fillers, MD as Consulting Physician (Ophthalmology) Gardiner Barefoot, DPM as Consulting Physician (Podiatry)  Indicate any recent Medical Services you may have received from other than [facility] providers in the past year (date may be approximate).     Assessment:   This is a routine wellness examination for Benjamin Serrano.  Hearing/Vision screen No results found.  Dietary issues and exercise activities discussed: Current Exercise Habits: Home exercise routine, Type of exercise: walking, Time (Minutes): 30, Frequency (Times/Week): 7, Weekly Exercise (Minutes/Week):  210, Intensity: Mild, Exercise limited by: None identified   Goals Addressed   None    Depression Screen PHQ 2/9 Scores 01/21/2021 06/20/2020 12/15/2019 12/13/2018 11/29/2017  PHQ - 2 Score _0 PHQ- 9 Score - - - 3 4    Fall Risk Fall Risk  01/21/2021 06/20/2020 12/15/2019 12/15/2019 12/13/2018  Falls in the past year? 0 0 0 0 0  Number falls in past yr: 0 - - 0 0  Injury with Fall? 0 - - 0 -  Risk for fall due to : - - - No Fall Risks -  Follow up - - - Falls evaluation completed -    FALL RISK PREVENTION PERTAINING TO THE HOME:  Any stairs in or around the home? Yes  If so, are there any without handrails? Yes  Home free of loose throw rugs in walkways, pet beds, electrical cords, etc? No  Adequate lighting in your home to reduce risk of falls? yes  ASSISTIVE DEVICES UTILIZED TO PREVENT FALLS:  Life alert? No  Use of a cane, walker or w/c? Yes  Grab bars in the bathroom? Yes  Shower chair or bench in shower? No  Elevated toilet seat or a handicapped toilet? No   TIMED UP AND GO:  Was the test performed? No .   Cognitive Function: MMSE - Mini Mental State Exam 12/13/2018 11/29/2017  Not completed: Unable to complete Refused     6CIT Screen 01/21/2021  What Year? 0 points  What month? 3 points  What time? 0  points  Count back from 20 4 points  Months in reverse 4 points  Repeat phrase 4 points  Total Score 15    Immunizations Immunization History  Administered Date(s) Administered   Influenza,inj,Quad PF,6+ Mos 05/30/2018, 06/20/2020   PFIZER(Purple Top)SARS-COV-2 Vaccination 09/04/2019, 09/20/2019, 04/22/2020   Pneumococcal Polysaccharide-23 11/29/2017   Tdap 11/29/2017    TDAP status: Up to date  Flu Vaccine status: Due, Education has been provided regarding the importance of this vaccine. Advised may receive this vaccine at local pharmacy or Health Dept. Aware to provide a copy of the vaccination record if obtained from local pharmacy or Health Dept. Verbalized acceptance and understanding.  Pneumococcal vaccine status: Due, Education has been provided regarding the importance of this vaccine. Advised may receive this vaccine at local pharmacy or Health Dept. Aware to provide a copy of the vaccination record if obtained from local pharmacy or Health Dept. Verbalized acceptance and understanding.  Covid-19 vaccine status: Information provided on how to obtain vaccines.   Qualifies for Shingles Vaccine? No   Zostavax completed No   Shingrix Completed?: No.    Education has been provided regarding the importance of this vaccine. Patient has been advised to call insurance company to determine out of pocket expense if they have not yet received this vaccine. Advised may also receive vaccine at local pharmacy or Health Dept. Verbalized acceptance and understanding.  Screening Tests Health Maintenance  Topic Date Due   Zoster Vaccines- Shingrix (1 of 2) Never done   Pneumococcal Vaccine 57-61 Years old (2 - PCV) 11/30/2018   COVID-19 Vaccine (4 - Booster) 08/21/2020   OPHTHALMOLOGY EXAM  10/11/2020   FOOT EXAM  12/14/2020   INFLUENZA VACCINE  12/16/2020   URINE MICROALBUMIN  06/20/2021   HEMOGLOBIN A1C  06/22/2021   COLONOSCOPY (Pts 45-58yr Insurance coverage will need to be  confirmed)  01/29/2022   TETANUS/TDAP  11/30/2027   Hepatitis C Screening  Completed  HIV Screening  Completed   HPV VACCINES  Aged Out    Health Maintenance  Health Maintenance Due  Topic Date Due   Zoster Vaccines- Shingrix (1 of 2) Never done   Pneumococcal Vaccine 106-48 Years old (2 - PCV) 11/30/2018   COVID-19 Vaccine (4 - Booster) 08/21/2020   OPHTHALMOLOGY EXAM  10/11/2020   FOOT EXAM  12/14/2020   INFLUENZA VACCINE  12/16/2020    Colorectal cancer screening: Type of screening: Colonoscopy. Completed 01/30/2019. Repeat every 3 years  Lung Cancer Screening: (Low Dose CT Chest recommended if Age 40-80 years, 30 pack-year currently smoking OR have quit w/in 15years.) does qualify.   Lung Cancer Screening Referral: PCP to obtain  Additional Screening:  Hepatitis C Screening: does qualify; Completed 11/29/2017  Vision Screening: Recommended annual ophthalmology exams for early detection of glaucoma and other disorders of the eye. Is the patient up to date with their annual eye exam?  No  Who is the provider or what is the name of the office in which the patient attends annual eye exams? [facility] If pt is not established with a provider, would they like to be referred to a provider to establish care? No .   Dental Screening: Recommended annual dental exams for proper oral hygiene  Community Resource Referral / Chronic Care Management: CRR required this visit?  No   CCM required this visit?  No      Plan:     I have personally reviewed and noted the following in the patient's chart:   Medical and social history Use of alcohol, tobacco or illicit drugs  Current medications and supplements including opioid prescriptions. Patient is currently taking opioid prescriptions. Information provided to patient regarding non-opioid alternatives. Patient advised to discuss non-opioid treatment plan with their provider. Functional ability and status Nutritional  status Physical activity Advanced directives List of other physicians Hospitalizations, surgeries, and ER visits in previous 12 months Vitals Screenings to include cognitive, depression, and falls Referrals and appointments  In addition, I have reviewed and discussed with patient certain preventive protocols, quality metrics, and best practice recommendations. A written personalized care plan for preventive services as well as general preventive health recommendations were provided to patient.     Cannon Kettle, [place]   01/21/2021   Nurse Notes: 35 minutes non face to face

## 2021-01-21 NOTE — Progress Notes (Signed)
Patient ID: Benjamin Serrano, male   DOB: 26-Nov-1955, 65 y.o.   MRN: [id number]  Medical screening examination/treatment/procedure(s) were performed by non-physician practitioner and as supervising physician I was immediately available for consultation/collaboration.  I agree with above. Cathlean Cower, MD

## 2021-01-21 NOTE — Patient Instructions (Signed)
Health Maintenance, Male Adopting a healthy lifestyle and getting preventive care are important in promoting health and wellness. Ask your health care provider about: The right schedule for you to have regular tests and exams. Things you can do on your own to prevent diseases and keep yourself healthy. What should I know about diet, weight, and exercise? Eat a healthy diet  Eat a diet that includes plenty of vegetables, fruits, low-fat dairy products, and lean protein. Do not eat a lot of foods that are high in solid fats, added sugars, or sodium. Maintain a healthy weight Body mass index (BMI) is a measurement that can be used to identify possible weight problems. It estimates body fat based on height and weight. Your health care provider can help determine your BMI and help you achieve or maintain a healthy weight. Get regular exercise Get regular exercise. This is one of the most important things you can do for your health. Most adults should: Exercise for at least 150 minutes each week. The exercise should increase your heart rate and make you sweat (moderate-intensity exercise). Do strengthening exercises at least twice a week. This is in addition to the moderate-intensity exercise. Spend less time sitting. Even light physical activity can be beneficial. Watch cholesterol and blood lipids Have your blood tested for lipids and cholesterol at 64 years of age, then have this test every 5 years. You may need to have your cholesterol levels checked more often if: Your lipid or cholesterol levels are high. You are older than 64 years of age. You are at high risk for heart disease. What should I know about cancer screening? Many types of cancers can be detected early and may often be prevented. Depending on your health history and family history, you may need to have cancer screening at various ages. This may include screening for: Colorectal cancer. Prostate cancer. Skin cancer. Lung  cancer. What should I know about heart disease, diabetes, and high blood pressure? Blood pressure and heart disease High blood pressure causes heart disease and increases the risk of stroke. This is more likely to develop in people who have high blood pressure readings, are of African descent, or are overweight. Talk with your health care provider about your target blood pressure readings. Have your blood pressure checked: Every 3-5 years if you are 18-39 years of age. Every year if you are 40 years old or older. If you are between the ages of 65 and 75 and are a current or former smoker, ask your health care provider if you should have a one-time screening for abdominal aortic aneurysm (AAA). Diabetes Have regular diabetes screenings. This checks your fasting blood sugar level. Have the screening done: Once every three years after age 45 if you are at a normal weight and have a low risk for diabetes. More often and at a younger age if you are overweight or have a high risk for diabetes. What should I know about preventing infection? Hepatitis B If you have a higher risk for hepatitis B, you should be screened for this virus. Talk with your health care provider to find out if you are at risk for hepatitis B infection. Hepatitis C Blood testing is recommended for: Everyone born from 1945 through 1965. Anyone with known risk factors for hepatitis C. Sexually transmitted infections (STIs) You should be screened each year for STIs, including gonorrhea and chlamydia, if: You are sexually active and are younger than 64 years of age. You are older than 64 years   of age and your health care provider tells you that you are at risk for this type of infection. Your sexual activity has changed since you were last screened, and you are at increased risk for chlamydia or gonorrhea. Ask your health care provider if you are at risk. Ask your health care provider about whether you are at high risk for HIV.  Your health care provider may recommend a prescription medicine to help prevent HIV infection. If you choose to take medicine to prevent HIV, you should first get tested for HIV. You should then be tested every 3 months for as long as you are taking the medicine. Follow these instructions at home: Lifestyle Do not use any products that contain nicotine or tobacco, such as cigarettes, e-cigarettes, and chewing tobacco. If you need help quitting, ask your health care provider. Do not use street drugs. Do not share needles. Ask your health care provider for help if you need support or information about quitting drugs. Alcohol use Do not drink alcohol if your health care provider tells you not to drink. If you drink alcohol: Limit how much you have to 0-2 drinks a day. Be aware of how much alcohol is in your drink. In the [place] one drink equals one 12 oz bottle of beer (355 mL), one 5 oz glass of wine (148 mL), or one 1 oz glass of hard liquor (44 mL). General instructions Schedule regular health, dental, and eye exams. Stay current with your vaccines. Tell your health care provider if: You often feel depressed. You have ever been abused or do not feel safe at home. Summary Adopting a healthy lifestyle and getting preventive care are important in promoting health and wellness. Follow your health care provider's instructions about healthy diet, exercising, and getting tested or screened for diseases. Follow your health care provider's instructions on monitoring your cholesterol and blood pressure. This information is not intended to replace advice given to you by your health care provider. Make sure you discuss any questions you have with your health care provider. Document Revised: 07/12/2020 Document Reviewed: 04/27/2018 Elsevier Patient Education  2022 [place] you for enrolling in [facility]. Please follow the instructions below to securely access your online medical record.  MyChart allows you to send messages to your doctor, view your test results, manage appointments, and more.   How Do I Sign Up? In your Internet browser, go to [employer] and enter [email or web address]. Click on the Sign Up Now link in the Sign In box. You will see the New Member Sign Up page. Enter your MyChart Access Code exactly as it appears below. You will not need to use this code after you've completed the sign-up process. If you do not sign up before the expiration date, you must request a new code.  MyChart Access Code: [id number] Expires: 02/03/2021  8:46 AM  Enter your Social Security Number ([id number]) and Date of Birth (mm/dd/yyyy) as indicated and click Submit. You will be taken to the next sign-up page. Create a MyChart ID. This will be your MyChart login ID and cannot be changed, so think of one that is secure and easy to remember. Create a Scientist, research (physical sciences). You can change your password at any time. Enter your Password Reset Question and Answer. This can be used at a later time if you forget your password.  Enter your e-mail address. You will receive e-mail notification when new information is available in [facility]. Click Sign Up. You can now view  your medical record.   Additional Information Remember, MyChart is NOT to be used for urgent needs. For medical emergencies, dial 911.

## 2021-02-03 ENCOUNTER — Other Ambulatory Visit: Payer: Self-pay | Admitting: Internal Medicine

## 2021-02-03 DIAGNOSIS — E119 Type 2 diabetes mellitus without complications: Secondary | ICD-10-CM

## 2021-02-03 NOTE — Telephone Encounter (Signed)
Please refill as per office routine med refill policy (all routine meds to be refilled for 3 mo or monthly (per pt preference) up to one year from last visit, then month to month grace period for 3 mo, then further med refills will have to be denied) ? ?

## 2021-02-07 ENCOUNTER — Ambulatory Visit (INDEPENDENT_AMBULATORY_CARE_PROVIDER_SITE_OTHER): Payer: Medicare Other | Admitting: Podiatry

## 2021-02-07 ENCOUNTER — Encounter: Payer: Self-pay | Admitting: Podiatry

## 2021-02-07 ENCOUNTER — Other Ambulatory Visit: Payer: Self-pay

## 2021-02-07 DIAGNOSIS — Q828 Other specified congenital malformations of skin: Secondary | ICD-10-CM

## 2021-02-07 DIAGNOSIS — M779 Enthesopathy, unspecified: Secondary | ICD-10-CM | POA: Diagnosis not present

## 2021-02-07 NOTE — Progress Notes (Signed)
This patient presents to the office with severe pain in callus left forefoot.  He says this area has been worsening over time.  He says he has worn pads but the problem persists. Patient is diabetic.  He says this callus is worsening and is related to his activity level.   He presents for evaluation and treatment.  Vascular  Dorsalis pedis and posterior tibial pulses are palpable  B/L.  Capillary return  WNL.  Temperature gradient is  WNL.  Skin turgor  WNL  Sensorium  Senn Weinstein monofilament wire absent.   Absent  tactile sensation.  Nail Exam  Patient has normal nails with no evidence of bacterial or fungal infection.  Orthopedic  Exam  Muscle tone and muscle strength  WNL.  No limitations of motion feet  B/L.  No crepitus or joint effusion noted.  Foot type is unremarkable and digits show no abnormalities.  Bony prominences are unremarkable.Plantar flexed fourth metatarsal left foot.  Palpable pain sub 4th met left foot.  Skin  No open lesions.  Normal skin texture and turgor.  Porokeratosis sub 4th  left forefoot.  Capsulitis 4th met left foot.  Porokeratosis sub 4th   IE.  Debride porokeratosis sub 4th met left foot.  Padding applied to shoes.  RTC prn  Gardiner Barefoot DPM

## 2021-03-10 ENCOUNTER — Other Ambulatory Visit: Payer: Self-pay | Admitting: Internal Medicine

## 2021-03-10 DIAGNOSIS — I10 Essential (primary) hypertension: Secondary | ICD-10-CM

## 2021-03-10 NOTE — Telephone Encounter (Signed)
Please refill as per office routine med refill policy (all routine meds to be refilled for 3 mo or monthly (per pt preference) up to one year from last visit, then month to month grace period for 3 mo, then further med refills will have to be denied) ? ?

## 2021-03-20 ENCOUNTER — Other Ambulatory Visit: Payer: Self-pay | Admitting: Internal Medicine

## 2021-03-20 NOTE — Telephone Encounter (Signed)
Please refill as per office routine med refill policy (all routine meds to be refilled for 3 mo or monthly (per pt preference) up to one year from last visit, then month to month grace period for 3 mo, then further med refills will have to be denied) ? ?

## 2021-05-23 ENCOUNTER — Other Ambulatory Visit: Payer: Self-pay | Admitting: Internal Medicine

## 2021-06-07 ENCOUNTER — Other Ambulatory Visit: Payer: Self-pay | Admitting: Internal Medicine

## 2021-06-07 DIAGNOSIS — I10 Essential (primary) hypertension: Secondary | ICD-10-CM

## 2021-06-07 NOTE — Telephone Encounter (Signed)
Please refill as per office routine med refill policy (all routine meds to be refilled for 3 mo or monthly (per pt preference) up to one year from last visit, then month to month grace period for 3 mo, then further med refills will have to be denied) ? ?

## 2021-06-10 ENCOUNTER — Other Ambulatory Visit: Payer: Self-pay | Admitting: Internal Medicine

## 2021-06-10 DIAGNOSIS — E119 Type 2 diabetes mellitus without complications: Secondary | ICD-10-CM

## 2021-06-10 NOTE — Telephone Encounter (Signed)
Please refill as per office routine med refill policy (all routine meds to be refilled for 3 mo or monthly (per pt preference) up to one year from last visit, then month to month grace period for 3 mo, then further med refills will have to be denied) ? ?

## 2021-06-27 ENCOUNTER — Encounter: Payer: Self-pay | Admitting: Internal Medicine

## 2021-06-27 ENCOUNTER — Ambulatory Visit (INDEPENDENT_AMBULATORY_CARE_PROVIDER_SITE_OTHER): Payer: Medicare Other | Admitting: Internal Medicine

## 2021-06-27 ENCOUNTER — Ambulatory Visit (INDEPENDENT_AMBULATORY_CARE_PROVIDER_SITE_OTHER): Payer: Medicare Other

## 2021-06-27 ENCOUNTER — Other Ambulatory Visit: Payer: Self-pay

## 2021-06-27 VITALS — BP 120/70 | HR 73 | Temp 98.2°F | Ht 68.0 in | Wt 211.0 lb

## 2021-06-27 DIAGNOSIS — E1165 Type 2 diabetes mellitus with hyperglycemia: Secondary | ICD-10-CM

## 2021-06-27 DIAGNOSIS — E538 Deficiency of other specified B group vitamins: Secondary | ICD-10-CM

## 2021-06-27 DIAGNOSIS — Z0001 Encounter for general adult medical examination with abnormal findings: Secondary | ICD-10-CM

## 2021-06-27 DIAGNOSIS — I10 Essential (primary) hypertension: Secondary | ICD-10-CM

## 2021-06-27 DIAGNOSIS — R079 Chest pain, unspecified: Secondary | ICD-10-CM | POA: Insufficient documentation

## 2021-06-27 DIAGNOSIS — I700 Atherosclerosis of aorta: Secondary | ICD-10-CM | POA: Diagnosis not present

## 2021-06-27 DIAGNOSIS — E782 Mixed hyperlipidemia: Secondary | ICD-10-CM

## 2021-06-27 DIAGNOSIS — E559 Vitamin D deficiency, unspecified: Secondary | ICD-10-CM

## 2021-06-27 DIAGNOSIS — Z23 Encounter for immunization: Secondary | ICD-10-CM | POA: Diagnosis not present

## 2021-06-27 DIAGNOSIS — Z720 Tobacco use: Secondary | ICD-10-CM | POA: Diagnosis not present

## 2021-06-27 LAB — LIPID PANEL
Cholesterol: 130 mg/dL (ref 0–200)
HDL: 46.9 mg/dL (ref >39.00)
LDL Cholesterol: 52 mg/dL (ref 0–99)
NonHDL: 83.35
Total CHOL/HDL Ratio: 3
Triglycerides: 155 mg/dL — ABNORMAL HIGH (ref 0.0–149.0)
VLDL: 31 mg/dL (ref 0.0–40.0)

## 2021-06-27 LAB — CBC WITH DIFFERENTIAL/PLATELET
Basophils Absolute: 0 10*3/uL (ref 0.0–0.1)
Basophils Relative: 0.5 % (ref 0.0–3.0)
Eosinophils Absolute: 0.1 10*3/uL (ref 0.0–0.7)
Eosinophils Relative: 1.7 % (ref 0.0–5.0)
HCT: 41.1 % (ref 39.0–52.0)
Hemoglobin: 13.8 g/dL (ref 13.0–17.0)
Lymphocytes Relative: 31.2 % (ref 12.0–46.0)
Lymphs Abs: 1.6 10*3/uL (ref 0.7–4.0)
MCHC: 33.5 g/dL (ref 30.0–36.0)
MCV: 90.1 fl (ref 78.0–100.0)
Monocytes Absolute: 0.4 10*3/uL (ref 0.1–1.0)
Monocytes Relative: 8.6 % (ref 3.0–12.0)
Neutro Abs: 3 10*3/uL (ref 1.4–7.7)
Neutrophils Relative %: 58 % (ref 43.0–77.0)
Platelets: 247 10*3/uL (ref 150.0–400.0)
RBC: 4.57 Mil/uL (ref 4.22–5.81)
RDW: 14.2 % (ref 11.5–15.5)
WBC: 5.1 10*3/uL (ref 4.0–10.5)

## 2021-06-27 LAB — BASIC METABOLIC PANEL
BUN: 19 mg/dL (ref 6–23)
CO2: 33 mEq/L — ABNORMAL HIGH (ref 19–32)
Calcium: 9.5 mg/dL (ref 8.4–10.5)
Chloride: 103 mEq/L (ref 96–112)
Creatinine, Ser: 1.13 mg/dL (ref 0.40–1.50)
GFR: 68.43 mL/min (ref >60.00)
Glucose, Bld: 113 mg/dL — ABNORMAL HIGH (ref 70–99)
Potassium: 4.5 mEq/L (ref 3.5–5.1)
Sodium: 142 mEq/L (ref 135–145)

## 2021-06-27 LAB — URINALYSIS, ROUTINE W REFLEX MICROSCOPIC
Bilirubin Urine: NEGATIVE
Hgb urine dipstick: NEGATIVE
Ketones, ur: 15 — AB
Leukocytes,Ua: NEGATIVE
Nitrite: NEGATIVE
RBC / HPF: NONE SEEN (ref 0–?)
Specific Gravity, Urine: >=1.03 — AB (ref 1.000–1.030)
Total Protein, Urine: NEGATIVE
Urine Glucose: 250 — AB
Urobilinogen, UA: 0.2 (ref 0.0–1.0)
pH: 5.5 (ref 5.0–8.0)

## 2021-06-27 LAB — HEPATIC FUNCTION PANEL
ALT: 20 U/L (ref 0–53)
AST: 17 U/L (ref 0–37)
Albumin: 4.3 g/dL (ref 3.5–5.2)
Alkaline Phosphatase: 57 U/L (ref 39–117)
Bilirubin, Direct: 0 mg/dL (ref 0.0–0.3)
Total Bilirubin: 0.3 mg/dL (ref 0.2–1.2)
Total Protein: 6.9 g/dL (ref 6.0–8.3)

## 2021-06-27 LAB — MICROALBUMIN / CREATININE URINE RATIO
Creatinine,U: 216.8 mg/dL
Microalb Creat Ratio: 0.7 mg/g (ref 0.0–30.0)
Microalb, Ur: 1.6 mg/dL (ref 0.0–1.9)

## 2021-06-27 LAB — TSH: TSH: 0.59 u[IU]/mL (ref 0.35–5.50)

## 2021-06-27 LAB — VITAMIN D 25 HYDROXY (VIT D DEFICIENCY, FRACTURES): VITD: 45.03 ng/mL (ref 30.00–100.00)

## 2021-06-27 LAB — VITAMIN B12: Vitamin B-12: 494 pg/mL (ref 211–911)

## 2021-06-27 LAB — PSA: PSA: 1.58 ng/mL (ref 0.10–4.00)

## 2021-06-27 LAB — HEMOGLOBIN A1C: Hgb A1c MFr Bld: 7.4 % — ABNORMAL HIGH (ref 4.6–6.5)

## 2021-06-27 NOTE — Assessment & Plan Note (Signed)
Lab Results  Component Value Date   HGBA1C 7.5 (H) 12/20/2020   Mild uncontrolled now on increased glipizide Er 10 mg -, pt to continue current medical treatment and check f/u a1c, cont diet

## 2021-06-27 NOTE — Patient Instructions (Signed)
You had the flu shot today, and the Prevnar 20 pneumonia shot  Your EKG was done today  Please continue all other medications as before, and refills have been done if requested.  Please have the pharmacy call with any other refills you may need.  Please continue your efforts at being more active, low cholesterol diet, and weight control.  You are otherwise up to date with prevention measures today.  Please keep your appointments with your specialists as you may have planned  Please go to the XRAY Department in the first floor for the x-ray testing  Please go to the LAB at the blood drawing area for the tests to be done  You will be contacted by phone if any changes need to be made immediately.  Otherwise, you will receive a letter about your results with an explanation, but please check with MyChart first.  Please remember to sign up for MyChart if you have not done so, as this will be important to you in the future with finding out test results, communicating by private email, and scheduling acute appointments online when needed.  Please make an Appointment to return in 6 months, or sooner if needed

## 2021-06-27 NOTE — Progress Notes (Signed)
Patient ID: Benjamin Serrano, male   DOB: 1956/01/31, 66 y.o.   MRN: [id number]         Chief Complaint:: wellness exam and dm, chest pain       HPI:  Benjamin Serrano is a 66 y.o. male here for wellness exam; declines covid booster, shingrix, but ok for flu shot, and prevnar 20, o/w up to date                        Also has some tingling to the toes mild intermittent for several months, without severe pain or weakness, gait change or falls.  Pt denies increased sob or doe, wheezing, orthopnea, PND, increased LE swelling, palpitations, dizziness or syncope., but has had mild intermittent fleeting sharp and dull chest pains to the mid upper and left upper chest without radiation.   Pt denies fever, wt loss, night sweats, loss of appetite, or other constitutional symptoms  No cough.   Pt denies polydipsia, polyuria, or new focal neuro s/s.    Wt Readings from Last 3 Encounters:  06/27/21 211 lb (95.7 kg)  12/20/20 194 lb (88 kg)  06/20/20 211 lb (95.7 kg)   BP Readings from Last 3 Encounters:  06/27/21 120/70  12/20/20 (!) 148/82  06/20/20 136/70   Immunization History  Administered Date(s) Administered   Fluad Quad(high Dose 65+) 06/27/2021   Influenza,inj,Quad PF,6+ Mos 05/30/2018, 06/20/2020   PFIZER(Purple Top)SARS-COV-2 Vaccination 09/04/2019, 09/20/2019, 04/22/2020, 12/12/2020   PNEUMOCOCCAL CONJUGATE-20 06/27/2021   Pneumococcal Polysaccharide-23 11/29/2017   Tdap 11/29/2017   There are no preventive care reminders to display for this patient.     Past Medical History:  Diagnosis Date   Anxiety 06/20/2020   Aortic atherosclerosis ([place]) 06/20/2020   Asthma    BPH (benign prostatic hyperplasia) 06/20/2020   Depression    Diabetes mellitus without complication ([place])    Dyslexia 06/20/2020   Essential hypertension 09/23/2016   Hyperlipemia    Hyperlipidemia 09/23/2016   Hypertension    Poor short-term memory 06/20/2020   Traumatic brain injury 06/20/2020   Type 2 diabetes mellitus  ([place]) 09/23/2016   Past Surgical History:  Procedure Laterality Date   Arm surgery Right    LEG SURGERY      reports that he has been smoking cigarettes. He has a 86.00 pack-year smoking history. He has never used smokeless tobacco. He reports current alcohol use. He reports that he does not use drugs. family history includes Alzheimer's disease in his maternal grandmother; Diabetes in his mother; Hypertension in his maternal grandmother. No Known Allergies Current Outpatient Medications on File Prior to Visit  Medication Sig Dispense Refill   alfuzosin (UROXATRAL) 10 MG 24 hr tablet TAKE 1 TABLET BY MOUTH EVERY DAY AT BEDTIME 90 tablet 0   amLODipine (NORVASC) 10 MG tablet Take 1 tablet by mouth once daily 90 tablet 0   aspirin EC 81 MG tablet Take 81 mg by mouth daily.     Blood Glucose Monitoring Suppl (ONE TOUCH ULTRA 2) w/Device KIT Use to check blood sugars twice a day 1 each 0   buPROPion (WELLBUTRIN XL) 150 MG 24 hr tablet Take 1 tablet by mouth once daily 90 tablet 2   finasteride (PROSCAR) 5 MG tablet Take 5 mg by mouth daily.     glipiZIDE (GLUCOTROL XL) 10 MG 24 hr tablet Take 1 tablet (10 mg total) by mouth daily with breakfast. 90 tablet 3   metFORMIN (GLUCOPHAGE) 1000 MG  tablet TAKE 1 TABLET BY MOUTH TWICE DAILY WITH A MEAL 180 tablet 3   ONETOUCH DELICA LANCETS 16X MISC Use to check blood sugars 2 times daily 100 each 5   ONETOUCH ULTRA test strip USE 1 STRIP TO CHECK GLUCOSE TWICE DAILY 100 each 0   rosuvastatin (CRESTOR) 20 MG tablet Take 1 tablet by mouth once daily 90 tablet 3   tamsulosin (FLOMAX) 0.4 MG CAPS capsule Take 1 capsule (0.4 mg total) by mouth daily. Annual appt due in July must see provider for future refills (Patient not taking: Reported on 06/27/2021) 90 capsule 3   No current facility-administered medications on file prior to visit.        ROS:  All others reviewed and negative.  Objective        PE:  BP 120/70 (BP Location: Left Arm, Patient Position:  Sitting, Cuff Size: Large)    Pulse 73    Temp 98.2 F (36.8 C) (Oral)    Ht '5\' 8"'  (1.727 m)    Wt 211 lb (95.7 kg)    SpO2 97%    BMI 32.08 kg/m                 Constitutional: Pt appears in NAD               HENT: Head: NCAT.                Right Ear: External ear normal.                 Left Ear: External ear normal.                Eyes: . Pupils are equal, round, and reactive to light. Conjunctivae and EOM are normal               Nose: without d/c or deformity               Neck: Neck supple. Gross normal ROM               Cardiovascular: Normal rate and regular rhythm.                 Pulmonary/Chest: Effort normal and breath sounds without rales or wheezing.                Abd:  Soft, NT, ND, + BS, no organomegaly               Neurological: Pt is alert. At baseline orientation, motor grossly intact               Skin: Skin is warm. No rashes, no other new lesions, LE edema - none               Psychiatric: Pt behavior is normal without agitation   Micro: none  Cardiac tracings I have personally interpreted today:  ECG - NSR 70  Pertinent Radiological findings (summarize): none   Lab Results  Component Value Date   WBC 5.1 06/27/2021   HGB 13.8 06/27/2021   HCT 41.1 06/27/2021   PLT 247.0 06/27/2021   GLUCOSE 113 (H) 06/27/2021   CHOL 130 06/27/2021   TRIG 155.0 (H) 06/27/2021   HDL 46.90 06/27/2021   LDLCALC 52 06/27/2021   ALT 20 06/27/2021   AST 17 06/27/2021   NA 142 06/27/2021   K 4.5 06/27/2021   CL 103 06/27/2021   CREATININE 1.13 06/27/2021   BUN 19 06/27/2021   CO2  33 (H) 06/27/2021   TSH 0.59 06/27/2021   PSA 1.58 06/27/2021   HGBA1C 7.4 (H) 06/27/2021   MICROALBUR 1.6 06/27/2021   Assessment/Plan:  Benjamin Serrano is a 66 y.o. Black or African American [2] male with  has a past medical history of Anxiety (06/20/2020), Aortic atherosclerosis ([place]) (06/20/2020), Asthma, BPH (benign prostatic hyperplasia) (06/20/2020), Depression, Diabetes mellitus without  complication ([place]), Dyslexia (06/20/2020), Essential hypertension (09/23/2016), Hyperlipemia, Hyperlipidemia (09/23/2016), Hypertension, Poor short-term memory (06/20/2020), Traumatic brain injury (06/20/2020), and Type 2 diabetes mellitus ([place]) (09/23/2016).  Type 2 diabetes mellitus ([facility]) Lab Results  Component Value Date   HGBA1C 7.5 (H) 12/20/2020   Mild uncontrolled now on increased glipizide Er 10 mg -, pt to continue current medical treatment and check f/u a1c, cont diet   Encounter for well adult exam with abnormal findings Age and sex appropriate education and counseling updated with regular exercise and diet Referrals for preventative services - none needed Immunizations addressed - declines shingrix and covid booster, but ok for flu shot and prevnar 20 Smoking counseling  - pt counseled to quit, pt not ready Evidence for depression or other mood disorder - none significant Most recent labs reviewed. I have personally reviewed and have noted: 1) the patient's medical and social history 2) The patient's current medications and supplements 3) The patient's height, weight, and BMI have been recorded in the chart   Tobacco abuse Pt counsled to quit, pt not ready  Aortic atherosclerosis ([place]) Pt to continue crestor, exercise, low chol diet  Chest pain Atypical, etiology unclear, ecg reviewed, for cxr, and labs as ordered, declines stress test for now,  to f/u any worsening symptoms or concerns  Essential hypertension BP Readings from Last 3 Encounters:  06/27/21 120/70  12/20/20 (!) 148/82  06/20/20 136/70   Stable, pt to continue medical treatment norvasc   Hyperlipidemia Lab Results  Component Value Date   LDLCALC 52 06/27/2021   Stable, pt to continue current statin crestor   Vitamin D deficiency Last vitamin D Lab Results  Component Value Date   VD25OH 45.03 06/27/2021   Stable, cont oral replacement  Followup: Return in about 6 months (around  12/25/2021).  Cathlean Cower, MD 06/29/2021 11:07 AM [facility]

## 2021-06-29 ENCOUNTER — Encounter: Payer: Self-pay | Admitting: Internal Medicine

## 2021-06-29 NOTE — Assessment & Plan Note (Signed)
Pt to continue crestor, exercise, low chol diet

## 2021-06-29 NOTE — Assessment & Plan Note (Signed)
Atypical, etiology unclear, ecg reviewed, for cxr, and labs as ordered, declines stress test for now,  to f/u any worsening symptoms or concerns

## 2021-06-29 NOTE — Assessment & Plan Note (Signed)
Last vitamin D Lab Results  Component Value Date   VD25OH 45.03 06/27/2021   Stable, cont oral replacement

## 2021-06-29 NOTE — Assessment & Plan Note (Signed)
Age and sex appropriate education and counseling updated with regular exercise and diet Referrals for preventative services - none needed Immunizations addressed - declines shingrix and covid booster, but ok for flu shot and prevnar 20 Smoking counseling  - pt counseled to quit, pt not ready Evidence for depression or other mood disorder - none significant Most recent labs reviewed. I have personally reviewed and have noted: 1) the patient's medical and social history 2) The patient's current medications and supplements 3) The patient's height, weight, and BMI have been recorded in the chart

## 2021-06-29 NOTE — Assessment & Plan Note (Signed)
Lab Results  Component Value Date   LDLCALC 52 06/27/2021   Stable, pt to continue current statin crestor

## 2021-06-29 NOTE — Assessment & Plan Note (Signed)
Pt counsled to quit, pt not ready °

## 2021-06-29 NOTE — Assessment & Plan Note (Signed)
BP Readings from Last 3 Encounters:  06/27/21 120/70  12/20/20 (!) 148/82  06/20/20 136/70   Stable, pt to continue medical treatment norvasc

## 2021-07-22 ENCOUNTER — Other Ambulatory Visit: Payer: Self-pay | Admitting: Internal Medicine

## 2021-07-22 ENCOUNTER — Other Ambulatory Visit: Payer: Self-pay | Admitting: Urology

## 2021-07-22 DIAGNOSIS — E119 Type 2 diabetes mellitus without complications: Secondary | ICD-10-CM

## 2021-09-09 ENCOUNTER — Other Ambulatory Visit: Payer: Self-pay | Admitting: Internal Medicine

## 2021-09-09 DIAGNOSIS — I10 Essential (primary) hypertension: Secondary | ICD-10-CM

## 2021-09-09 NOTE — Telephone Encounter (Signed)
Please refill as per office routine med refill policy (all routine meds to be refilled for 3 mo or monthly (per pt preference) up to one year from last visit, then month to month grace period for 3 mo, then further med refills will have to be denied) ? ?

## 2021-10-14 ENCOUNTER — Other Ambulatory Visit: Payer: Self-pay | Admitting: Urology

## 2021-10-18 ENCOUNTER — Other Ambulatory Visit: Payer: Self-pay | Admitting: Urology

## 2021-10-23 DIAGNOSIS — H04123 Dry eye syndrome of bilateral lacrimal glands: Secondary | ICD-10-CM | POA: Diagnosis not present

## 2021-10-23 DIAGNOSIS — E119 Type 2 diabetes mellitus without complications: Secondary | ICD-10-CM | POA: Diagnosis not present

## 2021-10-23 DIAGNOSIS — H2513 Age-related nuclear cataract, bilateral: Secondary | ICD-10-CM | POA: Diagnosis not present

## 2021-10-23 DIAGNOSIS — H40013 Open angle with borderline findings, low risk, bilateral: Secondary | ICD-10-CM | POA: Diagnosis not present

## 2021-12-03 DIAGNOSIS — H40013 Open angle with borderline findings, low risk, bilateral: Secondary | ICD-10-CM | POA: Diagnosis not present

## 2021-12-13 ENCOUNTER — Other Ambulatory Visit: Payer: Self-pay | Admitting: Internal Medicine

## 2021-12-13 NOTE — Telephone Encounter (Signed)
Please refill as per office routine med refill policy (all routine meds to be refilled for 3 mo or monthly (per pt preference) up to one year from last visit, then month to month grace period for 3 mo, then further med refills will have to be denied) ? ?

## 2021-12-14 ENCOUNTER — Other Ambulatory Visit: Payer: Self-pay | Admitting: Internal Medicine

## 2021-12-14 NOTE — Telephone Encounter (Signed)
Please refill as per office routine med refill policy (all routine meds to be refilled for 3 mo or monthly (per pt preference) up to one year from last visit, then month to month grace period for 3 mo, then further med refills will have to be denied) ? ?

## 2021-12-26 ENCOUNTER — Ambulatory Visit (INDEPENDENT_AMBULATORY_CARE_PROVIDER_SITE_OTHER): Payer: Medicare Other | Admitting: Internal Medicine

## 2021-12-26 ENCOUNTER — Other Ambulatory Visit: Payer: Self-pay | Admitting: Internal Medicine

## 2021-12-26 ENCOUNTER — Encounter: Payer: Self-pay | Admitting: Internal Medicine

## 2021-12-26 VITALS — BP 132/70 | HR 73 | Temp 98.1°F | Ht 68.0 in | Wt 206.0 lb

## 2021-12-26 DIAGNOSIS — Z720 Tobacco use: Secondary | ICD-10-CM

## 2021-12-26 DIAGNOSIS — E1165 Type 2 diabetes mellitus with hyperglycemia: Secondary | ICD-10-CM | POA: Diagnosis not present

## 2021-12-26 DIAGNOSIS — M170 Bilateral primary osteoarthritis of knee: Secondary | ICD-10-CM | POA: Diagnosis not present

## 2021-12-26 DIAGNOSIS — E559 Vitamin D deficiency, unspecified: Secondary | ICD-10-CM | POA: Diagnosis not present

## 2021-12-26 DIAGNOSIS — E782 Mixed hyperlipidemia: Secondary | ICD-10-CM

## 2021-12-26 DIAGNOSIS — I10 Essential (primary) hypertension: Secondary | ICD-10-CM

## 2021-12-26 LAB — BASIC METABOLIC PANEL
BUN: 18 mg/dL (ref 6–23)
CO2: 27 mEq/L (ref 19–32)
Calcium: 9.7 mg/dL (ref 8.4–10.5)
Chloride: 102 mEq/L (ref 96–112)
Creatinine, Ser: 1.3 mg/dL (ref 0.40–1.50)
GFR: 57.64 mL/min — ABNORMAL LOW (ref >60.00)
Glucose, Bld: 93 mg/dL (ref 70–99)
Potassium: 4.4 mEq/L (ref 3.5–5.1)
Sodium: 138 mEq/L (ref 135–145)

## 2021-12-26 LAB — LIPID PANEL
Cholesterol: 138 mg/dL (ref 0–200)
HDL: 48.7 mg/dL (ref >39.00)
LDL Cholesterol: 67 mg/dL (ref 0–99)
NonHDL: 88.96
Total CHOL/HDL Ratio: 3
Triglycerides: 109 mg/dL (ref 0.0–149.0)
VLDL: 21.8 mg/dL (ref 0.0–40.0)

## 2021-12-26 LAB — HEPATIC FUNCTION PANEL
ALT: 24 U/L (ref 0–53)
AST: 23 U/L (ref 0–37)
Albumin: 4.4 g/dL (ref 3.5–5.2)
Alkaline Phosphatase: 61 U/L (ref 39–117)
Bilirubin, Direct: 0 mg/dL (ref 0.0–0.3)
Total Bilirubin: 0.4 mg/dL (ref 0.2–1.2)
Total Protein: 7.3 g/dL (ref 6.0–8.3)

## 2021-12-26 LAB — VITAMIN D 25 HYDROXY (VIT D DEFICIENCY, FRACTURES): VITD: 38.08 ng/mL (ref 30.00–100.00)

## 2021-12-26 LAB — HEMOGLOBIN A1C: Hgb A1c MFr Bld: 7.8 % — ABNORMAL HIGH (ref 4.6–6.5)

## 2021-12-26 MED ORDER — VARENICLINE TARTRATE 1 MG PO TABS: 1.0000 mg | ORAL_TABLET | Freq: Two times a day (BID) | ORAL | 1 refills | Status: DC

## 2021-12-26 MED ORDER — VARENICLINE TARTRATE 0.5 MG X 11 & 1 MG X 42 PO TBPK
ORAL_TABLET | ORAL | 0 refills | Status: AC
Start: 2021-12-26 — End: ?

## 2021-12-26 MED ORDER — DAPAGLIFLOZIN PROPANEDIOL 10 MG PO TABS: 10.0000 mg | ORAL_TABLET | Freq: Every day | ORAL | 3 refills | Status: DC

## 2021-12-26 NOTE — Patient Instructions (Addendum)
Please take all new medication as prescribed - the chantix  Please continue all other medications as before, and refills have been done if requested.  Please have the pharmacy call with any other refills you may need.  Please continue your efforts at being more active, low cholesterol diet, and weight control.  Please keep your appointments with your specialists as you may have planned  You will be contacted regarding the referral for: Sports Medicine  Please make an Appointment to return in 6 months, or sooner if needed

## 2021-12-26 NOTE — Progress Notes (Unsigned)
Patient ID: Benjamin Serrano, male   DOB: Sep 19, 1955, 66 y.o.   MRN: [id number]        Chief Complaint: follow up HTN, HLD and hyperglycemia , smoker  bilateral knee arthritis       HPI:  Benjamin Serrano is a 66 y.o. male here overall doing ok; Pt denies chest pain, increased sob or doe, wheezing, orthopnea, PND, increased LE swelling, palpitations, dizziness or syncope.   Pt denies polydipsia, polyuria, or new focal neuro s/s.    Pt denies fever, wt loss, night sweats, loss of appetite, or other constitutional symptoms  Has 3 mo worsening bilateral knee arthritis pain with minimal swelling, no giveaways or falls  Lost 5 lbs wit better diet and exercise.  Still somking but now ready to quit.  Not taking Vit D Wt Readings from Last 3 Encounters:  12/26/21 206 lb (93.4 kg)  06/27/21 211 lb (95.7 kg)  12/20/20 194 lb (88 kg)   BP Readings from Last 3 Encounters:  12/26/21 132/70  06/27/21 120/70  12/20/20 (!) 148/82         Past Medical History:  Diagnosis Date   Anxiety 06/20/2020   Aortic atherosclerosis ([place]) 06/20/2020   Asthma    BPH (benign prostatic hyperplasia) 06/20/2020   Depression    Diabetes mellitus without complication ([place])    Dyslexia 06/20/2020   Essential hypertension 09/23/2016   Hyperlipemia    Hyperlipidemia 09/23/2016   Hypertension    Poor short-term memory 06/20/2020   Traumatic brain injury ([place]) 06/20/2020   Type 2 diabetes mellitus ([place]) 09/23/2016   Past Surgical History:  Procedure Laterality Date   Arm surgery Right    LEG SURGERY      reports that he has been smoking cigarettes. He has a 86.00 pack-year smoking history. He has never used smokeless tobacco. He reports current alcohol use. He reports that he does not use drugs. family history includes Alzheimer's disease in his maternal grandmother; Diabetes in his mother; Hypertension in his maternal grandmother. No Known Allergies Current Outpatient Medications on File Prior to Visit  Medication Sig Dispense  Refill   alfuzosin (UROXATRAL) 10 MG 24 hr tablet TAKE 1 TABLET BY MOUTH ONCE DAILY AT BEDTIME 90 tablet 0   amLODipine (NORVASC) 10 MG tablet Take 1 tablet by mouth once daily 90 tablet 2   aspirin EC 81 MG tablet Take 81 mg by mouth daily.     Blood Glucose Monitoring Suppl (ONE TOUCH ULTRA 2) w/Device KIT Use to check blood sugars twice a day 1 each 0   buPROPion (WELLBUTRIN XL) 150 MG 24 hr tablet Take 1 tablet by mouth once daily 90 tablet 2   finasteride (PROSCAR) 5 MG tablet Take 5 mg by mouth daily.     glipiZIDE (GLUCOTROL XL) 10 MG 24 hr tablet Take 1 tablet by mouth once daily with breakfast 90 tablet 0   latanoprost (XALATAN) 0.005 % ophthalmic solution 1 drop at bedtime.     metFORMIN (GLUCOPHAGE) 1000 MG tablet TAKE 1 TABLET BY MOUTH TWICE DAILY WITH A MEAL 180 tablet 3   ONETOUCH DELICA LANCETS 01U MISC Use to check blood sugars 2 times daily 100 each 5   ONETOUCH ULTRA test strip USE 1 STRIP TO CHECK GLUCOSE TWICE DAILY 100 each 0   rosuvastatin (CRESTOR) 20 MG tablet Take 1 tablet by mouth once daily 90 tablet 1   No current facility-administered medications on file prior to visit.  ROS:  All others reviewed and negative.  Objective        PE:  BP 132/70 (BP Location: Right Arm, Patient Position: Sitting, Cuff Size: Large)   Pulse 73   Temp 98.1 F (36.7 C) (Oral)   Ht $R'5\' 8"'Uf$  (1.727 m)   Wt 206 lb (93.4 kg)   SpO2 96%   BMI 31.32 kg/m                 Constitutional: Pt appears in NAD               HENT: Head: NCAT.                Right Ear: External ear normal.                 Left Ear: External ear normal.                Eyes: . Pupils are equal, round, and reactive to light. Conjunctivae and EOM are normal               Nose: without d/c or deformity               Neck: Neck supple. Gross normal ROM               Cardiovascular: Normal rate and regular rhythm.                 Pulmonary/Chest: Effort normal and breath sounds without rales or wheezing.                 Abd:  Soft, NT, ND, + BS, no organomegaly               Neurological: Pt is alert. At baseline orientation, motor grossly intact               Skin: Skin is warm. No rashes, no other new lesions, LE edema -none               Psychiatric: Pt behavior is normal without agitation   Micro: none  Cardiac tracings I have personally interpreted today:  none  Pertinent Radiological findings (summarize): none   Lab Results  Component Value Date   WBC 5.1 06/27/2021   HGB 13.8 06/27/2021   HCT 41.1 06/27/2021   PLT 247.0 06/27/2021   GLUCOSE 93 12/26/2021   CHOL 138 12/26/2021   TRIG 109.0 12/26/2021   HDL 48.70 12/26/2021   LDLCALC 67 12/26/2021   ALT 24 12/26/2021   AST 23 12/26/2021   NA 138 12/26/2021   K 4.4 12/26/2021   CL 102 12/26/2021   CREATININE 1.30 12/26/2021   BUN 18 12/26/2021   CO2 27 12/26/2021   TSH 0.59 06/27/2021   PSA 1.58 06/27/2021   HGBA1C 7.8 (H) 12/26/2021   MICROALBUR 1.6 06/27/2021   Assessment/Plan:  Benjamin Serrano is a 66 y.o. Black or African American [2] male with  has a past medical history of Anxiety (06/20/2020), Aortic atherosclerosis ([place]) (06/20/2020), Asthma, BPH (benign prostatic hyperplasia) (06/20/2020), Depression, Diabetes mellitus without complication ([place]), Dyslexia (06/20/2020), Essential hypertension (09/23/2016), Hyperlipemia, Hyperlipidemia (09/23/2016), Hypertension, Poor short-term memory (06/20/2020), Traumatic brain injury ([place]) (06/20/2020), and Type 2 diabetes mellitus ([place]) (09/23/2016).  Type 2 diabetes mellitus ([place]) . Lab Results  Component Value Date   HGBA1C 7.8 (H) 12/26/2021   uncontrolled, pt to continue current medical treatment glipizide xl 10 qd, metformin 1000 mg bid, farxiga 10 mg qd as decliens  change   Hyperlipidemia Lab Results  Component Value Date   LDLCALC 67 12/26/2021   Stable, pt to continue current statin crestor 20 mg qd   Essential hypertension BP Readings from Last 3 Encounters:  12/26/21  132/70  06/27/21 120/70  12/20/20 (!) 148/82   Stable, pt to continue medical treatment norvasc 10 mg qd   Tobacco abuse Pt counseled to quit, ok for chantix starter and continued total 3 mo  Vitamin D deficiency Last vitamin D Lab Results  Component Value Date   VD25OH 38.08 12/26/2021   Low, to start oral replacement   Degenerative arthritis of knee, bilateral Worsening mild uncontrolled, for volt gel prn, and refer sports medicine prn  Followup: No follow-ups on file.  Cathlean Cower, MD 12/28/2021 8:40 PM [facility]

## 2021-12-28 DIAGNOSIS — M170 Bilateral primary osteoarthritis of knee: Secondary | ICD-10-CM | POA: Insufficient documentation

## 2021-12-28 NOTE — Assessment & Plan Note (Addendum)
.   Lab Results  Component Value Date   HGBA1C 7.8 (H) 12/26/2021   uncontrolled, pt to continue current medical treatment glipizide xl 10 qd, metformin 1000 mg bid, farxiga 10 mg qd as decliens change

## 2021-12-28 NOTE — Assessment & Plan Note (Signed)
Worsening mild uncontrolled, for volt gel prn, and refer sports medicine prn

## 2021-12-28 NOTE — Assessment & Plan Note (Signed)
BP Readings from Last 3 Encounters:  12/26/21 132/70  06/27/21 120/70  12/20/20 (!) 148/82   Stable, pt to continue medical treatment norvasc 10 mg qd

## 2021-12-28 NOTE — Assessment & Plan Note (Signed)
Last vitamin D Lab Results  Component Value Date   VD25OH 38.08 12/26/2021   Low, to start oral replacement

## 2021-12-28 NOTE — Assessment & Plan Note (Signed)
Pt counseled to quit, ok for chantix starter and continued total 3 mo

## 2021-12-28 NOTE — Assessment & Plan Note (Signed)
Lab Results  Component Value Date   LDLCALC 67 12/26/2021   Stable, pt to continue current statin crestor 20 mg qd

## 2022-01-09 ENCOUNTER — Ambulatory Visit (INDEPENDENT_AMBULATORY_CARE_PROVIDER_SITE_OTHER): Payer: Medicare Other | Admitting: Family Medicine

## 2022-01-09 ENCOUNTER — Ambulatory Visit: Payer: Self-pay

## 2022-01-09 ENCOUNTER — Ambulatory Visit (INDEPENDENT_AMBULATORY_CARE_PROVIDER_SITE_OTHER): Payer: Medicare Other

## 2022-01-09 VITALS — BP 150/72 | HR 72 | Ht 68.0 in | Wt 204.0 lb

## 2022-01-09 DIAGNOSIS — G8929 Other chronic pain: Secondary | ICD-10-CM

## 2022-01-09 DIAGNOSIS — R413 Other amnesia: Secondary | ICD-10-CM

## 2022-01-09 DIAGNOSIS — S069X0S Unspecified intracranial injury without loss of consciousness, sequela: Secondary | ICD-10-CM

## 2022-01-09 DIAGNOSIS — M25562 Pain in left knee: Secondary | ICD-10-CM

## 2022-01-09 DIAGNOSIS — M25561 Pain in right knee: Secondary | ICD-10-CM

## 2022-01-09 NOTE — Patient Instructions (Addendum)
Thank you for coming in today.   Please use Voltaren gel (Generic Diclofenac Gel) up to 4x daily for pain as needed.  This is available over-the-counter as both the name brand Voltaren gel and the generic diclofenac gel.   I've referred you to Physical Therapy.  Let us know if you don't hear from them in one week.   Please get an Xray today before you leave   Recheck in 6 weeks.   Let me know if this is not going well.   If you think that we may be do a shot in 6 weeks let me know ahead of time and I can prescribe the anxiety medicine for you to take before that visits.

## 2022-01-09 NOTE — Progress Notes (Signed)
I, Peterson Lombard, LAT, ATC acting as a scribe for Lynne Leader, MD.  Subjective:    CC: Bilat knee pain  HPI: Pt is a 66 y/o male c/o bilat knee pain x 6 months. Pt locates pain to the anterior aspect of the both knees.  Knee swelling: yes Mechanical symptoms: yes Aggravates: walking incline, transitioning to stand Treatments tried: Voltaren gel, Tylneol  Pertinent review of Systems: No fevers or chills  Relevant historical information: History of TBI and short-term memory change.  Patient does not drive and lives at home.  His sister travels from [place] to take him to doctors appointments. Needle phobia  Objective:    Vitals:   01/09/22 1128  BP: (!) 150/72  Pulse: 72  SpO2: 96%   General: Well Developed, well nourished, and in no acute distress.   MSK: Right knee: Normal appearing Normal motion with crepitation.  Mildly tender palpation medial joint line. Stable ligamentous exam. Intact strength to extension with some pain.  Reduced strength to flexion 4/5 with some pain.   Left knee: Normal-appearing Normal motion with crepitation. Mild to the palpation medial joint line. Stable ligamentous exam. Intact strength to extension with some pain.  Reduced strength to flexion 4/5 with some pain.  Lab and Radiology Results  X-ray images bilateral knees obtained today personally and independently interpreted.  Right knee: Right intramedullary nail right tibia. Mild medial DJD and mild patellofemoral DJD.  No acute fractures.  Left knee: Mild medial DJD.  No acute fractures.  Await formal radiology review  Impression and Recommendations:    Assessment and Plan: 66 y.o. male with bilateral knee pain thought to be due to exacerbation of DJD and complicated by weakness. .  This has been a long ongoing process for years worsening recently.  He is already tried reasonable over-the-counter medications.  We will maximize and clarify Tylenol arthritis dose and  topical Voltaren gel.  Recommend avoiding high-dose oral NSAIDs due to hypertension.  Ideally strengthening with physical therapy is the next step.  However he does not drive and is effectively homebound due to his head injury.  Home health physical therapy would be a good choice.  We will place referral today.  Next step would be steroid injection however he has needle phobia and may need antianxiety medicines prescribed ahead of the next visit if not improved.  Check back in 6 weeks.  PDMP not reviewed this encounter. Orders Placed This Encounter  Procedures   DG Knee AP/LAT W/Sun[place] Left    Standing Status:   Future    Number of Occurrences:   1    Standing Expiration Date:   02/09/2022    Order Specific Question:   Reason for Exam (SYMPTOM  OR DIAGNOSIS REQUIRED)    Answer:   bilateral knee pain    Order Specific Question:   Preferred imaging location?    Answer:   [facility]   DG Knee AP/LAT W/Sun[place] Right    Standing Status:   Future    Number of Occurrences:   1    Standing Expiration Date:   02/09/2022    Order Specific Question:   Reason for Exam (SYMPTOM  OR DIAGNOSIS REQUIRED)    Answer:   bilateral knee pain    Order Specific Question:   Preferred imaging location?    Answer:   [facility]   Ambulatory referral to [facility]    Referral Priority:   Routine    Referral Type:  Home Health Care    Referral Reason:   Specialty Services Required    Requested Specialty:   [place]    Number of Visits Requested:   1   No orders of the defined types were placed in this encounter.   Discussed warning signs or symptoms. Please see discharge instructions. Patient expresses understanding.   The above documentation has been reviewed and is accurate and complete Lynne Leader, M.D.

## 2022-01-12 NOTE — Progress Notes (Signed)
Right knee x-ray shows a little bit of swelling.

## 2022-01-12 NOTE — Progress Notes (Signed)
Left knee x-ray shows a little bit of swelling and a tiny bit of arthritis changes but overall looks okay to radiology.

## 2022-01-26 ENCOUNTER — Ambulatory Visit (INDEPENDENT_AMBULATORY_CARE_PROVIDER_SITE_OTHER): Payer: Medicare Other

## 2022-01-26 ENCOUNTER — Other Ambulatory Visit: Payer: Self-pay | Admitting: Internal Medicine

## 2022-01-26 VITALS — Ht 66.0 in | Wt 220.0 lb

## 2022-01-26 DIAGNOSIS — Z556 Problems related to health literacy: Secondary | ICD-10-CM | POA: Diagnosis not present

## 2022-01-26 DIAGNOSIS — Z0000 Encounter for general adult medical examination without abnormal findings: Secondary | ICD-10-CM

## 2022-01-26 DIAGNOSIS — E119 Type 2 diabetes mellitus without complications: Secondary | ICD-10-CM

## 2022-01-26 DIAGNOSIS — Z1211 Encounter for screening for malignant neoplasm of colon: Secondary | ICD-10-CM | POA: Diagnosis not present

## 2022-01-26 NOTE — Progress Notes (Signed)
Subjective:   Benjamin Serrano is a 66 y.o. male who presents for Medicare Annual/Subsequent preventive examination.  Virtual Visit via Telephone Note  I connected with  MAURIO BAIZE on 01/26/22 at  9:15 AM EDT by telephone and verified that I am speaking with the correct person using two identifiers.  Location: Patient: home  Provider: [facility]  Persons participating in the virtual visit: patient/Nurse Health Advisor   I discussed the limitations, risks, security and privacy concerns of performing an evaluation and management service by telephone and the availability of in person appointments. The patient expressed understanding and agreed to proceed.  Interactive audio and video telecommunications were attempted between this nurse and patient, however failed, due to patient having technical difficulties OR patient did not have access to video capability.  We continued and completed visit with audio only.  Some vital signs may be absent or patient reported.   Daphane Shepherd, LPN  Review of Systems     Cardiac Risk Factors include: advanced age (>79mn, >>6women);diabetes mellitus;dyslipidemia;hypertension;male gender     Objective:    Today's Vitals   01/26/22 0920  Weight: 220 lb (99.8 kg)  Height: '5\' 6"'  (1.676 m)   Body mass index is 35.51 kg/m.     01/26/2022    9:36 AM 01/21/2021    6:09 PM 12/13/2018    1:23 PM 11/29/2017    9:40 AM  Advanced Directives  Does Patient Have a Medical Advance Directive? No No No No  Would patient like information on creating a medical advance directive? No - Patient declined Yes (ED - Information included in AVS) No - Patient declined Yes (ED - Information included in AVS)    Current Medications (verified) Outpatient Encounter Medications as of 01/26/2022  Medication Sig   alfuzosin (UROXATRAL) 10 MG 24 hr tablet TAKE 1 TABLET BY MOUTH ONCE DAILY AT BEDTIME   amLODipine (NORVASC) 10 MG tablet Take 1 tablet by mouth once  daily   aspirin EC 81 MG tablet Take 81 mg by mouth daily.   Blood Glucose Monitoring Suppl (ONE TOUCH ULTRA 2) w/Device KIT Use to check blood sugars twice a day   buPROPion (WELLBUTRIN XL) 150 MG 24 hr tablet Take 1 tablet by mouth once daily   finasteride (PROSCAR) 5 MG tablet Take 5 mg by mouth daily.   glipiZIDE (GLUCOTROL XL) 10 MG 24 hr tablet Take 1 tablet by mouth once daily with breakfast   latanoprost (XALATAN) 0.005 % ophthalmic solution 1 drop at bedtime.   metFORMIN (GLUCOPHAGE) 1000 MG tablet TAKE 1 TABLET BY MOUTH TWICE DAILY WITH A MEAL   ONETOUCH DELICA LANCETS 382UMISC Use to check blood sugars 2 times daily   ONETOUCH ULTRA test strip USE 1 STRIP TO CHECK GLUCOSE TWICE DAILY   rosuvastatin (CRESTOR) 20 MG tablet Take 1 tablet by mouth once daily   varenicline (CHANTIX CONTINUING MONTH PAK) 1 MG tablet Take 1 tablet (1 mg total) by mouth 2 (two) times daily.   varenicline (CHANTIX PAK) 0.5 MG X 11 & 1 MG X 42 tablet Take one 0.5 mg tablet by mouth once daily for 3 days, then increase to one 0.5 mg tablet twice daily for 4 days, then increase to one 1 mg tablet twice daily.   dapagliflozin propanediol (FARXIGA) 10 MG TABS tablet Take 1 tablet (10 mg total) by mouth daily before breakfast.   No facility-administered encounter medications on file as of 01/26/2022.    Allergies (verified) Patient  has no known allergies.   History: Past Medical History:  Diagnosis Date   Anxiety 06/20/2020   Aortic atherosclerosis ([place]) 06/20/2020   Asthma    BPH (benign prostatic hyperplasia) 06/20/2020   Depression    Diabetes mellitus without complication ([place])    Dyslexia 06/20/2020   Essential hypertension 09/23/2016   Hyperlipemia    Hyperlipidemia 09/23/2016   Hypertension    Poor short-term memory 06/20/2020   Traumatic brain injury ([place]) 06/20/2020   Type 2 diabetes mellitus ([place]) 09/23/2016   Past Surgical History:  Procedure Laterality Date   Arm surgery Right    LEG SURGERY      Family History  Problem Relation Age of Onset   Diabetes Mother    Alzheimer's disease Maternal Grandmother    Hypertension Maternal Grandmother    Colon cancer Neg Hx    Colon polyps Neg Hx    Social History   Socioeconomic History   Marital status: Widowed    Spouse name: Not on file   Number of children: 1   Years of education: 9   Highest education level: Not on file  Occupational History   Occupation: Disability    Comment: Mental health  Tobacco Use   Smoking status: Every Day    Packs/day: 2.00    Years: 43.00    Total pack years: 86.00    Types: Cigarettes   Smokeless tobacco: Never  Vaping Use   Vaping Use: Never used  Substance and Sexual Activity   Alcohol use: Yes    Comment: social   Drug use: No   Sexual activity: Not Currently  Other Topics Concern   Not on file  Social History Narrative   Fun/Hobby: Oceanographer.    Social Determinants of Health   Financial Resource Strain: Low Risk  (01/26/2022)   Overall Financial Resource Strain (CARDIA)    Difficulty of Paying Living Expenses: Not hard at all  Food Insecurity: No Food Insecurity (01/26/2022)   Hunger Vital Sign    Worried About Running Out of Food in the Last Year: Never true    Ran Out of Food in the Last Year: Never true  Transportation Needs: No Transportation Needs (01/26/2022)   PRAPARE - Hydrologist (Medical): No    Lack of Transportation (Non-Medical): No  Physical Activity: Insufficiently Active (01/26/2022)   Exercise Vital Sign    Days of Exercise per Week: 3 days    Minutes of Exercise per Session: 30 min  Stress: No Stress Concern Present (01/26/2022)   [place]    Feeling of Stress : Only a little  Social Connections: Moderately Isolated (01/26/2022)   Social Connection and Isolation Panel [NHANES]    Frequency of Communication with Friends and Family: More than three times a week     Frequency of Social Gatherings with Friends and Family: More than three times a week    Attends Religious Services: 1 to 4 times per year    Active Member of [employer] or Organizations: No    Attends Archivist Meetings: Never    Marital Status: Widowed    Tobacco Counseling Ready to quit: Not Answered Counseling given: Not Answered   Clinical Intake:  Pre-visit preparation completed: Yes  Pain : No/denies pain     Nutritional Risks: None Diabetes: Yes CBG done?: No Did pt. bring in CBG monitor from home?: No  How often do you need to have  someone help you when you read instructions, pamphlets, or other written materials from your doctor or pharmacy?: 4 - Often What is the last grade level you completed in school?: Difficulty reding medcation list  Diabetic?yes   Nutrition Risk Assessment:  Has the patient had any N/V/D within the last 2 months?  No  Does the patient have any non-healing wounds?  No  Has the patient had any unintentional weight loss or weight gain?  No   Diabetes:  Is the patient diabetic?  Yes  If diabetic, was a CBG obtained today?  No  Did the patient bring in their glucometer from home?  No  How often do you monitor your CBG's? never.   Financial Strains and Diabetes Management:  Are you having any financial strains with the device, your supplies or your medication? No .  Does the patient want to be seen by Chronic Care Management for management of their diabetes?  No  Would the patient like to be referred to a Nutritionist or for Diabetic Management?  No   Diabetic Exams:  Diabetic Eye Exam: Overdue for diabetic eye exam. Pt has been advised about the importance in completing this exam. Patient advised to call and schedule an eye exam. Diabetic Foot Exam: Overdue, Pt has been advised about the importance in completing this exam. Pt is scheduled for diabetic foot exam on next office visit .   Interpreter Needed?: No  Information  entered by :: Jadene Pierini , LPN   Activities of Daily Living    01/26/2022    9:36 AM  In your present state of health, do you have any difficulty performing the following activities:  Hearing? 0  Vision? 0  Difficulty concentrating or making decisions? 0  Walking or climbing stairs? 0  Dressing or bathing? 0  Doing errands, shopping? 0  Preparing Food and eating ? N  Using the Toilet? N  In the past six months, have you accidently leaked urine? N  Do you have problems with loss of bowel control? N  Managing your Medications? N  Managing your Finances? N  Housekeeping or managing your Housekeeping? N    Patient Care Team: Biagio Borg, MD as PCP - General (Internal Medicine) Warden Fillers, MD as Consulting Physician (Ophthalmology) Gardiner Barefoot, DPM as Consulting Physician (Podiatry)  Indicate any recent Medical Services you may have received from other than [facility] providers in the past year (date may be approximate).     Assessment:   This is a routine wellness examination for [place].  Hearing/Vision screen Vision Screening - Comments:: Annual eye exam with Dr. Katy Fitch due   Dietary issues and exercise activities discussed: Current Exercise Habits: Home exercise routine, Type of exercise: walking;strength training/weights, Time (Minutes): 30, Frequency (Times/Week): 3, Weekly Exercise (Minutes/Week): 90, Intensity: Mild, Exercise limited by: None identified   Goals Addressed             This Visit's Progress    Patient Stated   On track    Watch how many carbohydrates I eat daily. Drink glucerna and eat more routinely.       Depression Screen    01/26/2022    9:34 AM 12/26/2021   10:10 AM 06/27/2021   10:13 AM 01/21/2021    5:53 PM 06/20/2020    9:25 AM 12/15/2019   11:09 AM 12/13/2018    1:23 PM  PHQ 2/9 Scores  PHQ - 2 Score 0 0 '1 1 2 1 2  ' PHQ- 9 Score 0  0     3    Fall Risk    01/26/2022    9:24 AM 12/26/2021   10:10 AM 06/27/2021   10:11 AM  01/21/2021    6:03 PM 06/20/2020    9:25 AM  Fall Risk   Falls in the past year? 0 0 0 0 0  Number falls in past yr: 0 0 0 0   Injury with Fall? 0 0 0 0   Risk for fall due to : No Fall Risks No Fall Risks     Follow up Falls prevention discussed Falls evaluation completed       FALL RISK PREVENTION PERTAINING TO THE HOME:  Any stairs in or around the home? Yes  If so, are there any without handrails? No  Home free of loose throw rugs in walkways, pet beds, electrical cords, etc? Yes  Adequate lighting in your home to reduce risk of falls? Yes   ASSISTIVE DEVICES UTILIZED TO PREVENT FALLS:  Life alert? No  Use of a cane, walker or w/c? Yes  Grab bars in the bathroom? Yes  Shower chair or bench in shower? No  Elevated toilet seat or a handicapped toilet? No       12/13/2018    2:47 PM 11/29/2017    9:50 AM  MMSE - Mini Mental State Exam  Not completed: Unable to complete Refused        01/26/2022    9:37 AM 01/21/2021    6:06 PM  6CIT Screen  What Year? 0 points 0 points  What month? 0 points 3 points  What time? 0 points 0 points  Count back from 20 0 points 4 points  Months in reverse 0 points 4 points  Repeat phrase 0 points 4 points  Total Score 0 points 15 points    Immunizations Immunization History  Administered Date(s) Administered   Fluad Quad(high Dose 65+) 06/27/2021   Influenza,inj,Quad PF,6+ Mos 05/30/2018, 06/20/2020   PFIZER(Purple Top)SARS-COV-2 Vaccination 09/04/2019, 09/20/2019, 04/22/2020, 12/12/2020   PNEUMOCOCCAL CONJUGATE-20 06/27/2021   Pneumococcal Polysaccharide-23 11/29/2017   Tdap 11/29/2017    TDAP status: Up to date  Flu Vaccine status: Up to date  Pneumococcal vaccine status: Up to date  Covid-19 vaccine status: Completed vaccines  Qualifies for Shingles Vaccine? Yes   Zostavax completed No   Shingrix Completed?: No.    Education has been provided regarding the importance of this vaccine. Patient has been advised to call  insurance company to determine out of pocket expense if they have not yet received this vaccine. Advised may also receive vaccine at local pharmacy or Health Dept. Verbalized acceptance and understanding.  Screening Tests Health Maintenance  Topic Date Due   Zoster Vaccines- Shingrix (1 of 2) Never done   COVID-19 Vaccine (5 - Pfizer series) 02/06/2021   INFLUENZA VACCINE  08/16/2022 (Originally 12/16/2021)   OPHTHALMOLOGY EXAM  01/27/2022   COLONOSCOPY (Pts 45-54yr Insurance coverage will need to be confirmed)  01/29/2022   Diabetic kidney evaluation - Urine ACR  06/27/2022   FOOT EXAM  06/27/2022   HEMOGLOBIN A1C  06/28/2022   Diabetic kidney evaluation - GFR measurement  12/27/2022   TETANUS/TDAP  11/30/2027   Pneumonia Vaccine 65 Years old  Completed   Hepatitis C Screening  Completed   HIV Screening  Completed   HPV VACCINES  Aged Out    Health Maintenance  Health Maintenance Due  Topic Date Due   Zoster Vaccines- Shingrix (1 of 2) Never done  COVID-19 Vaccine (5 - Pfizer series) 02/06/2021    Colorectal cancer screening: Referral to GI placed 01/26/2022. Pt aware the office will call re: appt.  Lung Cancer Screening: (Low Dose CT Chest recommended if Age 15-80 years, 30 pack-year currently smoking OR have quit w/in 15years.) does not qualify.   Lung Cancer Screening Referral: n/a  Additional Screening:  Hepatitis C Screening: does not qualify;   Vision Screening: Recommended annual ophthalmology exams for early detection of glaucoma and other disorders of the eye. Is the patient up to date with their annual eye exam?  Yes  Who is the provider or what is the name of the office in which the patient attends annual eye exams? Dr.Groat  If pt is not established with a provider, would they like to be referred to a provider to establish care? No .   Dental Screening: Recommended annual dental exams for proper oral hygiene  Community Resource Referral / Chronic Care  Management: CRR required this visit?  No   CCM required this visit?  Yes  Diabetic education , polypharmacy and literacy in health     Plan:     I have personally reviewed and noted the following in the patient's chart:   Medical and social history Use of alcohol, tobacco or illicit drugs  Current medications and supplements including opioid prescriptions. Patient is not currently taking opioid prescriptions. Functional ability and status Nutritional status Physical activity Advanced directives List of other physicians Hospitalizations, surgeries, and ER visits in previous 12 months Vitals Screenings to include cognitive, depression, and falls Referrals and appointments  In addition, I have reviewed and discussed with patient certain preventive protocols, quality metrics, and best practice recommendations. A written personalized care plan for preventive services as well as general preventive health recommendations were provided to patient.     Daphane Shepherd, LPN   [id number]/35/8251   Nurse Notes: Due Colonoscopy referral 01/26/2022 , Eye doctor follow up, literacy in health education and diabetes  Referral 2300 submitted

## 2022-01-26 NOTE — Telephone Encounter (Signed)
Please refill as per office routine med refill policy (all routine meds to be refilled for 3 mo or monthly (per pt preference) up to one year from last visit, then month to month grace period for 3 mo, then further med refills will have to be denied) ? ?

## 2022-01-26 NOTE — Patient Instructions (Addendum)
Benjamin Serrano , Thank you for taking time to come for your Medicare Wellness Visit. I appreciate your ongoing commitment to your health goals. Please review the following plan we discussed and let me know if I can assist you in the future.   Screening recommendations/referrals: Colonoscopy: referral 01/26/2022 Recommended yearly ophthalmology/optometry visit for glaucoma screening and checkup Recommended yearly dental visit for hygiene and checkup  Vaccinations: Influenza vaccine: due in the fall  Pneumococcal vaccine: completed  Tdap vaccine: 12/03/2017 Shingles vaccine: will consider    Covid-19: completed  Advanced directives: Advance directive discussed with you today. I have provided a copy for you to complete at home and have notarized. Once this is complete please bring a copy in to our office so we can scan it into your chart.   Conditions/risks identified: Follow with Dr. Tyrone Schimke Doctor Aim for 30 minutes of exercise or brisk walking, 6-8 glasses of water, and 5 servings of fruits and vegetables each day.   Next appointment: Follow up in one year for your annual wellness visit.   Preventive Care 38 Years and Older, Male  Preventive care refers to lifestyle choices and visits with your health care provider that can promote health and wellness. What does preventive care include? A yearly physical exam. This is also called an annual well check. Dental exams once or twice a year. Routine eye exams. Ask your health care provider how often you should have your eyes checked. Personal lifestyle choices, including: Daily care of your teeth and gums. Regular physical activity. Eating a healthy diet. Avoiding tobacco and drug use. Limiting alcohol use. Practicing safe sex. Taking low doses of aspirin every day. Taking vitamin and mineral supplements as recommended by your health care provider. What happens during an annual well check? The services and screenings done by your  health care provider during your annual well check will depend on your age, overall health, lifestyle risk factors, and family history of disease. Counseling  Your health care provider may ask you questions about your: Alcohol use. Tobacco use. Drug use. Emotional well-being. Home and relationship well-being. Sexual activity. Eating habits. History of falls. Memory and ability to understand (cognition). Work and work Statistician. Screening  You may have the following tests or measurements: Height, weight, and BMI. Blood pressure. Lipid and cholesterol levels. These may be checked every 5 years, or more frequently if you are over 69 years old. Skin check. Lung cancer screening. You may have this screening every year starting at age 57 if you have a 30-pack-year history of smoking and currently smoke or have quit within the past 15 years. Fecal occult blood test (FOBT) of the stool. You may have this test every year starting at age 39. Flexible sigmoidoscopy or colonoscopy. You may have a sigmoidoscopy every 5 years or a colonoscopy every 10 years starting at age 81. Prostate cancer screening. Recommendations will vary depending on your family history and other risks. Hepatitis C blood test. Hepatitis B blood test. Sexually transmitted disease (STD) testing. Diabetes screening. This is done by checking your blood sugar (glucose) after you have not eaten for a while (fasting). You may have this done every 1-3 years. Abdominal aortic aneurysm (AAA) screening. You may need this if you are a current or former smoker. Osteoporosis. You may be screened starting at age 72 if you are at high risk. Talk with your health care provider about your test results, treatment options, and if necessary, the need for more tests. Vaccines  Your  health care provider may recommend certain vaccines, such as: Influenza vaccine. This is recommended every year. Tetanus, diphtheria, and acellular pertussis  (Tdap, Td) vaccine. You may need a Td booster every 10 years. Zoster vaccine. You may need this after age 19. Pneumococcal 13-valent conjugate (PCV13) vaccine. One dose is recommended after age 62. Pneumococcal polysaccharide (PPSV23) vaccine. One dose is recommended after age 22. Talk to your health care provider about which screenings and vaccines you need and how often you need them. This information is not intended to replace advice given to you by your health care provider. Make sure you discuss any questions you have with your health care provider. Document Released: 05/31/2015 Document Revised: 01/22/2016 Document Reviewed: 03/05/2015 Elsevier Interactive Patient Education  2017 [employer] Prevention in the Home Falls can cause injuries. They can happen to people of all ages. There are many things you can do to make your home safe and to help prevent falls. What can I do on the outside of my home? Regularly fix the edges of walkways and driveways and fix any cracks. Remove anything that might make you trip as you walk through a door, such as a raised step or threshold. Trim any bushes or trees on the path to your home. Use bright outdoor lighting. Clear any walking paths of anything that might make someone trip, such as rocks or tools. Regularly check to see if handrails are loose or broken. Make sure that both sides of any steps have handrails. Any raised decks and porches should have guardrails on the edges. Have any leaves, snow, or ice cleared regularly. Use sand or salt on walking paths during winter. Clean up any spills in your garage right away. This includes oil or grease spills. What can I do in the bathroom? Use night lights. Install grab bars by the toilet and in the tub and shower. Do not use towel bars as grab bars. Use non-skid mats or decals in the tub or shower. If you need to sit down in the shower, use a plastic, non-slip stool. Keep the floor dry. Clean  up any water that spills on the floor as soon as it happens. Remove soap buildup in the tub or shower regularly. Attach bath mats securely with double-sided non-slip rug tape. Do not have throw rugs and other things on the floor that can make you trip. What can I do in the bedroom? Use night lights. Make sure that you have a light by your bed that is easy to reach. Do not use any sheets or blankets that are too big for your bed. They should not hang down onto the floor. Have a firm chair that has side arms. You can use this for support while you get dressed. Do not have throw rugs and other things on the floor that can make you trip. What can I do in the kitchen? Clean up any spills right away. Avoid walking on wet floors. Keep items that you use a lot in easy-to-reach places. If you need to reach something above you, use a strong step stool that has a grab bar. Keep electrical cords out of the way. Do not use floor polish or wax that makes floors slippery. If you must use wax, use non-skid floor wax. Do not have throw rugs and other things on the floor that can make you trip. What can I do with my stairs? Do not leave any items on the stairs. Make sure that there are handrails  on both sides of the stairs and use them. Fix handrails that are broken or loose. Make sure that handrails are as long as the stairways. Check any carpeting to make sure that it is firmly attached to the stairs. Fix any carpet that is loose or worn. Avoid having throw rugs at the top or bottom of the stairs. If you do have throw rugs, attach them to the floor with carpet tape. Make sure that you have a light switch at the top of the stairs and the bottom of the stairs. If you do not have them, ask someone to add them for you. What else can I do to help prevent falls? Wear shoes that: Do not have high heels. Have rubber bottoms. Are comfortable and fit you well. Are closed at the toe. Do not wear sandals. If you  use a stepladder: Make sure that it is fully opened. Do not climb a closed stepladder. Make sure that both sides of the stepladder are locked into place. Ask someone to hold it for you, if possible. Clearly mark and make sure that you can see: Any grab bars or handrails. First and last steps. Where the edge of each step is. Use tools that help you move around (mobility aids) if they are needed. These include: Canes. Walkers. Scooters. Crutches. Turn on the lights when you go into a dark area. Replace any light bulbs as soon as they burn out. Set up your furniture so you have a clear path. Avoid moving your furniture around. If any of your floors are uneven, fix them. If there are any pets around you, be aware of where they are. Review your medicines with your doctor. Some medicines can make you feel dizzy. This can increase your chance of falling. Ask your doctor what other things that you can do to help prevent falls. This information is not intended to replace advice given to you by your health care provider. Make sure you discuss any questions you have with your health care provider. Document Released: 02/28/2009 Document Revised: 10/10/2015 Document Reviewed: 06/08/2014 Elsevier Interactive Patient Education  2017 [employer].

## 2022-01-27 ENCOUNTER — Telehealth: Payer: Self-pay | Admitting: *Deleted

## 2022-01-27 NOTE — Chronic Care Management (AMB) (Signed)
  Care Coordination   Note   01/27/2022 Name: [name] MRN: [id number] DOB: Oct 07, 1955  Benjamin Serrano is a 66 y.o. year old male who sees Biagio Borg, MD for primary care. I reached out to [name] by phone today to offer care coordination services.  [name] was given information about Care Coordination services today including:   The Care Coordination services include support from the care team which includes your Nurse Coordinator, Clinical Social Worker, or Pharmacist.  The Care Coordination team is here to help remove barriers to the health concerns and goals most important to you. Care Coordination services are voluntary, and the patient may decline or stop services at any time by request to their care team member.   Care Coordination Consent Status: Patient agreed to services and verbal consent obtained.   Follow up plan:  Telephone appointment with care coordination team member scheduled for:  01/30/2022  Encounter Outcome:  Pt. Scheduled  Julian Hy, [place] Direct Dial: [phone]

## 2022-01-30 ENCOUNTER — Ambulatory Visit: Payer: Self-pay

## 2022-01-30 NOTE — Patient Instructions (Signed)
Visit Information  Thank you for taking time to visit with me today. Please don't hesitate to contact me if I can be of assistance to you.   Following are the goals we discussed today:   Goals Addressed             This Visit's Progress    Blood sugar management       Care Coordination Interventions: Reviewed medications with patient and discussed importance of medication adherence Discussed blood sugar results Encouraged patient to continue to check and record blood sugar Discussed signs/symptoms of low blood sugar and importance of checking blood sugar if he feels different or thinks blood sugar may be low to see what it actually is prior to treating. Collaboration with family as needed. Discussed target blood sugar range 80-130 before meals and <180 about 2 hours after meals with sister.         Our next appointment is by telephone on 02/12/22 at 11:30  Please call the care guide team at [phone] if you need to cancel or reschedule your appointment.   If you are experiencing a Mental Health or [place] or need someone to talk to, please call the Suicide and Crisis Lifeline: 988  Patient verbalizes understanding of instructions and care plan provided today and agrees to view in [place]. Active MyChart status and patient understanding of how to access instructions and care plan via MyChart confirmed with patient.     Thea Silversmith, RN, MSN, BSN, [place] Coordinator [phone]

## 2022-01-30 NOTE — Patient Outreach (Signed)
  Care Coordination   Initial Visit Note   01/30/2022 Name: MECHEL SCHUTTER MRN: [id number] DOB: [birth year]  JERONIMO HELLBERG is a 66 y.o. year old male who sees Biagio Borg, MD for primary care. I spoke with sister Dorian Pod [id number] and  TERRIAN SENTELL by phone today.  What matters to the patients health and wellness today?  Blood sugar management. Patient reports he has started back to checking and recording blood sugars. Reports blood sugar this morning was 136. Thursday 137 am and 119 pm; Wednesday 122 am and 102 pm. History of traumatic brain injury. Needs simple instructions for education. Per sister patient is managing medications without difficulty. She reports she follows up with patient to ensure he is taking medications. She reports only issue is that he will not let them know when medications needs to be refilled earlier than about 2 days.    Goals Addressed             This Visit's Progress    Blood sugar management       Care Coordination Interventions: Reviewed medications with patient and discussed importance of medication adherence Discussed blood sugar results Encouraged patient to continue to check and record blood sugar Discussed signs/symptoms of low blood sugar and importance of checking blood sugar if he feels different or thinks blood sugar may be low to see what it actually is prior to treating. Collaboration with family as needed. Discussed target blood sugar range 80-130 before meals and <180 about 2 hours after meals with sister.         SDOH assessments and interventions completed:  Yes  SDOH Interventions Today    Flowsheet Row Most Recent Value  SDOH Interventions   Food Insecurity Interventions Intervention Not Indicated  Transportation Interventions Intervention Not Indicated        Care Coordination Interventions Activated:  Yes  Care Coordination Interventions:  Yes, provided   Follow up plan: Follow up call scheduled for  02/12/22    Encounter Outcome:  Pt. Visit Completed   Thea Silversmith, RN, MSN, BSN, [place] Coordinator [phone]

## 2022-01-31 ENCOUNTER — Other Ambulatory Visit: Payer: Self-pay | Admitting: Internal Medicine

## 2022-01-31 DIAGNOSIS — E119 Type 2 diabetes mellitus without complications: Secondary | ICD-10-CM

## 2022-02-04 ENCOUNTER — Encounter: Payer: Self-pay | Admitting: Gastroenterology

## 2022-02-12 ENCOUNTER — Ambulatory Visit: Payer: Self-pay

## 2022-02-12 NOTE — Patient Instructions (Signed)
Visit Information  Thank you for taking time to visit with me today. Please don't hesitate to contact me if I can be of assistance to you.   Following are the goals we discussed today:   Goals Addressed             This Visit's Progress    Blood sugar management       Care Coordination Interventions: Reviewed blood sugar results from 01/28/22 -02/11/22 with patient. Range 83-172 in the am and 102-152 in the pm Provided positive feedback regarding blood sugar check and Encouraged patient to continue to check and record blood sugar Reiterated the and signs/symptoms of low blood sugar and importance of checking blood sugar if he feels different or thinks blood sugar may be low to determine the actual reading prior to treating. Collaboration with family as needed. Discussed target blood sugar range 80-130 before meals and <180 about 2 hours after meals with sister.       Our next appointment is by telephone on 03/23/22 at 11:30 am  Please call the care guide team at [phone] if you need to cancel or reschedule your appointment.   If you are experiencing a Mental Health or [place] or need someone to talk to, please call the Suicide and Crisis Lifeline: 988  Patient verbalizes understanding of instructions and care plan provided today and agrees to view in [place]. Active MyChart status and patient understanding of how to access instructions and care plan via MyChart confirmed with patient.     Thea Silversmith, RN, MSN, BSN, [place] Coordinator [phone]

## 2022-02-12 NOTE — Patient Outreach (Signed)
  Care Coordination   Follow Up Visit Note   02/12/2022 Name: RAMOND DARNELL MRN: [id number] DOB: August 06, 1955  SHEFFIELD HAWKER is a 66 y.o. year old male who sees Biagio Borg, MD for primary care. I spoke with  [name] by phone today.  What matters to the patients health and wellness today?  Patient reports he has started checking and recording blood sugars twice a day. Denies any questions or concerns at this time.    Goals Addressed             This Visit's Progress    Blood sugar management       Care Coordination Interventions: Reviewed blood sugar results from 01/28/22 -02/11/22 with patient. Range 83-172 in the am and 102-152 in the pm Provided positive feedback regarding blood sugar check and Encouraged patient to continue to check and record blood sugar Reiterated the and signs/symptoms of low blood sugar and importance of checking blood sugar if he feels different or thinks blood sugar may be low to determine the actual reading prior to treating. Collaboration with family as needed. Discussed target blood sugar range 80-130 before meals and <180 about 2 hours after meals with sister.       SDOH assessments and interventions completed:  No  Care Coordination Interventions Activated:  Yes  Care Coordination Interventions:  Yes, provided   Follow up plan: Follow up call scheduled for 03/23/22    Encounter Outcome:  Pt. Visit Completed   Thea Silversmith, RN, MSN, BSN, [place] Coordinator [phone]

## 2022-02-15 ENCOUNTER — Other Ambulatory Visit: Payer: Self-pay | Admitting: Internal Medicine

## 2022-02-16 ENCOUNTER — Other Ambulatory Visit: Payer: Self-pay | Admitting: Internal Medicine

## 2022-02-20 ENCOUNTER — Ambulatory Visit (INDEPENDENT_AMBULATORY_CARE_PROVIDER_SITE_OTHER): Payer: Medicare Other | Admitting: Family Medicine

## 2022-02-20 VITALS — BP 152/86 | HR 72 | Ht 66.0 in | Wt 207.0 lb

## 2022-02-20 DIAGNOSIS — M170 Bilateral primary osteoarthritis of knee: Secondary | ICD-10-CM

## 2022-02-20 DIAGNOSIS — G8929 Other chronic pain: Secondary | ICD-10-CM

## 2022-02-20 DIAGNOSIS — F40298 Other specified phobia: Secondary | ICD-10-CM

## 2022-02-20 DIAGNOSIS — M25562 Pain in left knee: Secondary | ICD-10-CM | POA: Diagnosis not present

## 2022-02-20 DIAGNOSIS — M25561 Pain in right knee: Secondary | ICD-10-CM

## 2022-02-20 MED ORDER — LORAZEPAM 0.5 MG PO TABS: ORAL_TABLET | ORAL | 0 refills | Status: AC

## 2022-02-20 MED ORDER — LIDOCAINE-PRILOCAINE 2.5-2.5 % EX CREA: 1.0000 | TOPICAL_CREAM | CUTANEOUS | 12 refills | Status: AC | PRN

## 2022-02-20 NOTE — Patient Instructions (Signed)
Thank you for coming in today.   Schedule a time to come back for injection.  Take the lorazapam anxiety medicine 1-2 pill about 1 hour prior to the visit.   Apply the EMLA cream to outside of the knee and cover with saran wrap about 1 hour prior to the visit.   This trip works well.

## 2022-02-20 NOTE — Progress Notes (Signed)
   I, Peterson Lombard, LAT, ATC acting as a scribe for Lynne Leader, MD.  Benjamin Serrano is a 66 y.o. male who presents to [place] at [facility] today for f/u chronic bilat knee pain. Pt is needle phobic. He does not drive and is effectively homebound due to a prior head injury. Pt was last seen by Dr. Georgina Snell on 01/09/22 and was advised on Tylenol dosing and Voltaren gel and was referred to home health PT. Today, pt reports bilat knee pain is up and down. Pt notes bilat knees are feeling OK today. Pt is wanting to discuss the idea of possible injection in the future.  Home health physical therapy never did get started.  Dx imaging: 01/09/22 R & L knee XR  Pertinent review of systems: No fevers or chills  Relevant historical information: Hypertension.  History of traumatic brain injury.   Exam:  BP (!) 152/86   Pulse 72   Ht '5\' 6"'$  (1.676 m)   Wt 207 lb (93.9 kg)   SpO2 97%   BMI 33.41 kg/m  General: Well Developed, well nourished, and in no acute distress.   MSK: Bilateral knees normal appearing.  Normal motion.      Assessment and Plan: 66 y.o. male with bilateral knee pain thought to be due to exacerbation of DJD.  Neck step really should be steroid injection.  However he has significant needle phobia so getting through cortisone shots or skin to be challenging.  We talked about ways to help with that.  We will prescribe some lorazepam to take at a time and use Emla cream ahead time which should produce pain and anxiety significantly. Recheck in the near future with the appropriate precautions in place to make the first knee injection and a more positive experience.  Total encounter time 20 minutes including face-to-face time with the patient and, reviewing past medical record, and charting on the date of service.      PDMP reviewed during this encounter. No orders of the defined types were placed in this encounter.  Meds ordered this encounter   Medications   LORazepam (ATIVAN) 0.5 MG tablet    Sig: 1-2 tabs 30 - 60 min prior to injection. Do not drive with this medicine.    Dispense:  4 tablet    Refill:  0   lidocaine-prilocaine (EMLA) cream    Sig: Apply 1 Application topically as needed. To area 1 hour prior to injection and cover with saran wrap to numb the skin    Dispense:  30 g    Refill:  12     Discussed warning signs or symptoms. Please see discharge instructions. Patient expresses understanding.   The above documentation has been reviewed and is accurate and complete Lynne Leader, M.D.

## 2022-02-23 ENCOUNTER — Ambulatory Visit: Payer: Self-pay

## 2022-02-23 ENCOUNTER — Ambulatory Visit (INDEPENDENT_AMBULATORY_CARE_PROVIDER_SITE_OTHER): Payer: Medicare Other | Admitting: Family Medicine

## 2022-02-23 DIAGNOSIS — G8929 Other chronic pain: Secondary | ICD-10-CM

## 2022-02-23 DIAGNOSIS — M25561 Pain in right knee: Secondary | ICD-10-CM

## 2022-02-23 DIAGNOSIS — M25562 Pain in left knee: Secondary | ICD-10-CM | POA: Diagnosis not present

## 2022-02-23 DIAGNOSIS — M170 Bilateral primary osteoarthritis of knee: Secondary | ICD-10-CM | POA: Diagnosis not present

## 2022-02-23 NOTE — Patient Instructions (Signed)
Thank you for coming in today.   You received an injection today. Seek immediate medical attention if the joint becomes red, extremely painful, or is oozing fluid.  

## 2022-02-23 NOTE — Progress Notes (Signed)
Deakin presents to clinic today for BL knee injection He has a history of severe needle phobia and returns taking lorazepam and having applied Emla cream to both knees.  Procedure: Real-time Ultrasound Guided Injection of right knee superior lateral patellar space Device: Philips Affiniti 50G Images permanently stored and available for review in PACS Verbal informed consent obtained.  Discussed risks and benefits of procedure. Warned about infection, bleeding, hyperglycemia damage to structures among others. Patient expresses understanding and agreement Time-out conducted.   Noted no overlying erythema, induration, or other signs of local infection.   Skin prepped in a sterile fashion.   Local anesthesia: Topical Ethyl chloride.   With sterile technique and under real time ultrasound guidance: 40 mg of Kenalog and 2 mL of Marcaine injected into knee joint. Fluid seen entering the joint capsule.   Completed without difficulty   Pain immediately resolved suggesting accurate placement of the medication.   Advised to call if fevers/chills, erythema, induration, drainage, or persistent bleeding.   Images permanently stored and available for review in the ultrasound unit.  Impression: Technically successful ultrasound guided injection.    Procedure: Real-time Ultrasound Guided Injection of left knee superior lateral patellar space Device: Philips Affiniti 50G Images permanently stored and available for review in PACS Verbal informed consent obtained.  Discussed risks and benefits of procedure. Warned about infection, bleeding, hyperglycemia damage to structures among others. Patient expresses understanding and agreement Time-out conducted.   Noted no overlying erythema, induration, or other signs of local infection.   Skin prepped in a sterile fashion.   Local anesthesia: Topical Ethyl chloride.   With sterile technique and under real time ultrasound guidance: 40 mg of Kenalog and 2 mL of  Marcaine injected into knee joint. Fluid seen entering the joint capsule.   Completed without difficulty   Pain immediately resolved suggesting accurate placement of the medication.   Advised to call if fevers/chills, erythema, induration, drainage, or persistent bleeding.   Images permanently stored and available for review in the ultrasound unit.  Impression: Technically successful ultrasound guided injection.  Of note the injections went well without any pain.  Probably the next time we need to do knee injections he will not need the lorazepam.

## 2022-02-25 ENCOUNTER — Encounter: Payer: Self-pay | Admitting: Internal Medicine

## 2022-02-25 DIAGNOSIS — E119 Type 2 diabetes mellitus without complications: Secondary | ICD-10-CM

## 2022-02-25 DIAGNOSIS — I10 Essential (primary) hypertension: Secondary | ICD-10-CM

## 2022-03-07 ENCOUNTER — Other Ambulatory Visit: Payer: Self-pay | Admitting: Internal Medicine

## 2022-03-07 NOTE — Telephone Encounter (Signed)
Please refill as per office routine med refill policy (all routine meds to be refilled for 3 mo or monthly (per pt preference) up to one year from last visit, then month to month grace period for 3 mo, then further med refills will have to be denied) ? ?

## 2022-03-13 ENCOUNTER — Other Ambulatory Visit: Payer: Self-pay | Admitting: Internal Medicine

## 2022-03-13 NOTE — Telephone Encounter (Signed)
Please refill as per office routine med refill policy (all routine meds to be refilled for 3 mo or monthly (per pt preference) up to one year from last visit, then month to month grace period for 3 mo, then further med refills will have to be denied) ? ?

## 2022-03-16 ENCOUNTER — Other Ambulatory Visit: Payer: Self-pay | Admitting: Internal Medicine

## 2022-03-16 NOTE — Telephone Encounter (Signed)
See below , thanks

## 2022-03-16 NOTE — Telephone Encounter (Signed)
Patient is requesting that [facility]s [facility] in order to keep up with refills.

## 2022-03-16 NOTE — Telephone Encounter (Signed)
This should be ok, but we need clarify exacty which ones he refer to, as there is somewhat of a long list that includes other things

## 2022-03-19 ENCOUNTER — Other Ambulatory Visit: Payer: Self-pay | Admitting: Internal Medicine

## 2022-03-19 DIAGNOSIS — E119 Type 2 diabetes mellitus without complications: Secondary | ICD-10-CM

## 2022-03-19 MED ORDER — ONETOUCH ULTRA VI STRP: ORAL_STRIP | 3 refills | Status: DC

## 2022-03-19 MED ORDER — ROSUVASTATIN CALCIUM 20 MG PO TABS: 20.0000 mg | ORAL_TABLET | Freq: Every day | ORAL | 3 refills | Status: DC

## 2022-03-19 MED ORDER — BUPROPION HCL ER (XL) 150 MG PO TB24: 150.0000 mg | ORAL_TABLET | Freq: Every day | ORAL | 3 refills | Status: DC

## 2022-03-19 MED ORDER — METFORMIN HCL 1000 MG PO TABS: 1000.0000 mg | ORAL_TABLET | Freq: Two times a day (BID) | ORAL | 3 refills | Status: DC

## 2022-03-19 MED ORDER — AMLODIPINE BESYLATE 10 MG PO TABS: 10.0000 mg | ORAL_TABLET | Freq: Every day | ORAL | 3 refills | Status: DC

## 2022-03-19 MED ORDER — GLIPIZIDE ER 10 MG PO TB24: 10.0000 mg | ORAL_TABLET | Freq: Every day | ORAL | 3 refills | Status: DC

## 2022-03-19 MED ORDER — DAPAGLIFLOZIN PROPANEDIOL 10 MG PO TABS: 10.0000 mg | ORAL_TABLET | Freq: Every day | ORAL | 3 refills | Status: DC

## 2022-03-19 NOTE — Telephone Encounter (Signed)
Patient is still not being specific with which meds he'd like sent to [facility]. He continues to say all meds.

## 2022-03-23 ENCOUNTER — Ambulatory Visit: Payer: Self-pay

## 2022-03-23 NOTE — Patient Instructions (Signed)
Visit Information  Thank you for taking time to visit with me today. Please don't hesitate to contact me if I can be of assistance to you.   Following are the goals we discussed today:   Goals Addressed             This Visit's Progress    Blood sugar management       Care Coordination Interventions: Provided positive feedback regarding checking blood sugars and Encouraged patient to continue to check and record blood sugar Discussed the rule of 15 when blood sugar less than 70 and encouraged to recheck blood sugar 15 minutes after treating low blood sugar to ensure blood sugar is coming up. Patient encouraged to contact primary care provider for frequent episodes of low blood sugar 2 or more times.       The Care guide will call to schedule your next appointment. Please call the care guide team at [phone] if you need to cancel or reschedule your appointment.   If you are experiencing a Mental Health or [place] or need someone to talk to, please call the Suicide and Crisis Lifeline: 988  Patient verbalizes understanding of instructions and care plan provided today and agrees to view in [place]. Active MyChart status and patient understanding of how to access instructions and care plan via MyChart confirmed with patient.     Thea Silversmith, RN, MSN, BSN, [place] Coordinator [phone]

## 2022-03-23 NOTE — Patient Outreach (Signed)
  Care Coordination   Follow Up Visit Note   03/23/2022 Name: HOLTON SIDMAN MRN: [id number] DOB: [birth year]  DAMANY EASTMAN is a 66 y.o. year old male who sees Biagio Borg, MD for primary care. I spoke with  [name] by phone today.  What matters to the patients health and wellness today?  Mr. Steckman reports he has been checking his blood sugar. He denies any concerns at this time. Reports blood sugar less than 70 x1 on 10/31 66/67. He states he ate to treat. The next day on 03/18/22 morning blood sugar was 149. Per patient blood sugar range 66-149.   Goals Addressed             This Visit's Progress    Blood sugar management       Care Coordination Interventions: Provided positive feedback regarding checking blood sugars and Encouraged patient to continue to check and record blood sugar Discussed the rule of 15 when blood sugar less than 70 and encouraged to recheck blood sugar 15 minutes after treating low blood sugar to ensure blood sugar is coming up. Patient encouraged to contact primary care provider for frequent episodes of low blood sugar 2 or more times.       SDOH assessments and interventions completed:  No  Care Coordination Interventions Activated:  Yes  Care Coordination Interventions:  Yes, provided   Follow up plan: Patient request care guide to call to schedule follow up. Care guide to call to schedule next appointment.   Encounter Outcome:  Pt. Visit Completed   Thea Silversmith, RN, MSN, BSN, [place] Coordinator [phone]

## 2022-03-24 ENCOUNTER — Telehealth: Payer: Self-pay | Admitting: *Deleted

## 2022-03-24 NOTE — Progress Notes (Signed)
  Care Coordination Note  03/24/2022 Name: Benjamin Serrano MRN: [id number] DOB: [birth year]  Benjamin Serrano is a 66 y.o. year old male who is a primary care patient of Biagio Borg, MD and is actively engaged with the care management team. I reached out to [name] by phone today to assist with scheduling a follow up visit with the RN Case Manager  Follow up plan: Telephone appointment with care management team member scheduled for: 04/23/2022  Julian Hy, [place] Direct Dial: [phone]

## 2022-04-02 DIAGNOSIS — H04123 Dry eye syndrome of bilateral lacrimal glands: Secondary | ICD-10-CM | POA: Diagnosis not present

## 2022-04-02 DIAGNOSIS — E119 Type 2 diabetes mellitus without complications: Secondary | ICD-10-CM | POA: Diagnosis not present

## 2022-04-02 DIAGNOSIS — H40013 Open angle with borderline findings, low risk, bilateral: Secondary | ICD-10-CM | POA: Diagnosis not present

## 2022-04-02 DIAGNOSIS — H2513 Age-related nuclear cataract, bilateral: Secondary | ICD-10-CM | POA: Diagnosis not present

## 2022-04-23 ENCOUNTER — Ambulatory Visit: Payer: Self-pay

## 2022-04-23 NOTE — Patient Outreach (Signed)
  Care Coordination   Follow Up Visit Note   04/23/2022 Name: Benjamin Serrano MRN: [id number] DOB: Sep 28, 1955  Benjamin Serrano is a 66 y.o. year old male who sees Biagio Borg, MD for primary care. I spoke with  [name] by phone today.  What matters to the patients health and wellness today?  Mr. Sudol reports blood sugars have been: 137 fasting today; 04/22/22 85 am; 106 pm; 12/5 119147; 12/4 101 am; 193 pm. He reports he knows his body and can tell if he blood sugar is low and will eat something. However he reports low blood sugar is not an issue at this time. Mr. Rockers expresses sometimes get frustrated with family treating him like a child. He expresses he know it is out of concern. He expresses limited funds that limit him being able to socialize. He states he used to walk when he got frustrated, but he is unable to now due to h/o knee problems. He expresses he has his faith that helps him deal with it. He is receptive to LCSW referral.    Goals Addressed             This Visit's Progress    Blood sugar management       Care Coordination Interventions: Reiterated Target Blood Sugar parameters 80-130 fasting and <130 two hours after eating or per provider recommendation Referral made to social work team for assistance with socialization, strategies to improve communication Provided positive feedback regarding blood sugar management Encouraged patient to call RNCM for care coordination as needed. Provided contact number       SDOH assessments and interventions completed:  No  Care Coordination Interventions:  Yes, provided   Follow up plan: Follow up call scheduled for 05/04/22    Encounter Outcome:  Pt. Visit Completed   Thea Silversmith, RN, MSN, BSN, [place] Coordinator [phone]

## 2022-04-23 NOTE — Patient Instructions (Addendum)
Visit Information  Thank you for taking time to visit with me today. Please don't hesitate to contact me if I can be of assistance to you.   Following are the goals we discussed today:   Goals Addressed             This Visit's Progress    Blood sugar management       Care Coordination Interventions: Reiterated Target Blood Sugar parameters 80-130 fasting and <130 two hours after eating or per provider recommendation Referral made to social work team for assistance with socialization, strategies to improve communication Provided positive feedback regarding blood sugar management Encouraged patient to call RNCM for care coordination as needed. Provided contact number       Our next appointment is by telephone on 05/04/22 at 2:15 pm  Please call the care guide team at [phone] if you need to cancel or reschedule your appointment.   If you are experiencing a Mental Health or [place] or need someone to talk to, please call the Suicide and Crisis Lifeline: 988  Patient verbalizes understanding of instructions and care plan provided today and agrees to view in [place]. Active MyChart status and patient understanding of how to access instructions and care plan via MyChart confirmed with patient.     Thea Silversmith, RN, MSN, BSN, [place] Coordinator [phone]

## 2022-04-30 ENCOUNTER — Ambulatory Visit: Payer: Self-pay | Admitting: Licensed Clinical Social Worker

## 2022-04-30 NOTE — Patient Instructions (Signed)
Visit Information  Thank you for taking time to visit with me today. Please don't hesitate to contact me if I can be of assistance to you.    I am sorry you were unable to keep your phone appointment today.    Benjamin Serrano, [place] [phone]    Our next appointment is by telephone on Feb. 1st at 11:00  Please call the care guide team at [phone] if you need to cancel or reschedule your appointment.   If you are experiencing a Mental Health or [place] or need someone to talk to, please call the Suicide and Crisis Lifeline: 988 call the [place] National Suicide Prevention Lifeline: (574)765-0364 or TTY: 386-647-2460 TTY 680-352-5118) to talk to a trained counselor call 1-800-273-TALK (toll free, 24 hour hotline)   Patient verbalizes understanding of instructions and care plan provided today and agrees to view in [place]. Active MyChart status and patient understanding of how to access instructions and care plan via MyChart confirmed with patient.

## 2022-04-30 NOTE — Patient Outreach (Signed)
  Care Coordination  Initial Visit Note   04/30/2022 Name: Benjamin Serrano MRN: [id number] DOB: 08/13/1955  AZAHEL BELCASTRO is a 66 y.o. year old male who sees Biagio Borg, MD for primary care. I spoke with  [name] by phone today.  What matters to the patients health and wellness today?  Reports not feeling well and not in the mood to talk, would like to reschedule for Feb. .  However he expressed frustration ref. his living situation.    SDOH assessments and interventions completed:  No    Care Coordination Interventions:  No, not indicated   Follow up plan: Follow up call scheduled for Feb 1st    Encounter Outcome:  Pt. Visit Completed   Casimer Lanius, [place] [phone]

## 2022-05-04 ENCOUNTER — Ambulatory Visit: Payer: Self-pay

## 2022-05-04 NOTE — Patient Outreach (Signed)
  Care Coordination   Follow Up Visit Note   05/04/2022 Name: Benjamin Serrano MRN: [id number] DOB: 01/08/1956  Benjamin Serrano is a 66 y.o. year old male who sees Biagio Borg, MD for primary care. I spoke with  Benjamin Serrano by phone today.  What matters to the patients health and wellness today?  Patient reports his blood sugars are doing good. He continues to check and record blood sugar. He states he is scheduled to talk with LCSW in February. Discussed case closure. Patient is agreeable. Patient to contact RNCM if care coordination needs change and/or notify LCSW if care coordination needs change.    Goals Addressed             This Visit's Progress    COMPLETED: Blood sugar management       Care Coordination Interventions: Reviewed upcoming appointment. Reinforced, LCSW will be calling on 06/18/21 Encouraged patient to contact care coordination if needs change. Confirmed patient has contact number Reviewed blood sugars with patient-patient blood sugar today 107. Denies any episodes of hypoglycemia. Encouraged patient to take blood sugar readings with him to provider visit 07/03/21. Provided positive feedback regarding blood sugar management Discussed case closure. Confirmed Benjamin Serrano has RNCM's contact number and encouraged to call if care coordination or resource needs change. Patient verbalized confirmation that he is to contact primary care provider for health/medical needs.       SDOH assessments and interventions completed:  No  Care Coordination Interventions:  Yes, provided   Follow up plan: No further intervention required.   Encounter Outcome:  Pt. Visit Completed   Thea Silversmith, RN, MSN, BSN, [place] Coordinator [phone]

## 2022-05-04 NOTE — Patient Instructions (Signed)
Visit Information  Thank you for taking time to visit with me today. Please don't hesitate to contact me if I can be of assistance to you.   Following are the goals we discussed today:   Goals Addressed             This Visit's Progress    COMPLETED: Blood sugar management       Care Coordination Interventions: Reviewed upcoming appointment. Reinforced, LCSW will be calling on 06/18/21 Encouraged patient to contact care coordination if needs change. Confirmed patient has contact number Reviewed blood sugars with patient-patient blood sugar today 107. Denies any episodes of hypoglycemia. Encouraged patient to take blood sugar readings with him to provider visit 07/03/21. Provided positive feedback regarding blood sugar management Discussed case closure. Confirmed Benjamin Serrano has RNCM's contact number and encouraged to call if care coordination or resource needs change. Patient verbalized confirmation that he is to contact primary care provider for health/medical needs.       If you are experiencing a Mental Health or [place] or need someone to talk to, please call the Suicide and Crisis Lifeline: [place], RN, MSN, BSN, [place] [phone]

## 2022-05-08 ENCOUNTER — Other Ambulatory Visit: Payer: Self-pay | Admitting: Urology

## 2022-05-14 ENCOUNTER — Other Ambulatory Visit: Payer: Self-pay

## 2022-05-14 MED ORDER — ALFUZOSIN HCL ER 10 MG PO TB24: 10.0000 mg | ORAL_TABLET | Freq: Every day | ORAL | 0 refills | Status: DC

## 2022-05-14 NOTE — Progress Notes (Signed)
[facility] requested alfuzosin rx be sent.  Pharmacy updated and rx sent

## 2022-06-09 DIAGNOSIS — H40013 Open angle with borderline findings, low risk, bilateral: Secondary | ICD-10-CM | POA: Diagnosis not present

## 2022-06-09 DIAGNOSIS — H10023 Other mucopurulent conjunctivitis, bilateral: Secondary | ICD-10-CM | POA: Diagnosis not present

## 2022-06-18 ENCOUNTER — Ambulatory Visit: Payer: Self-pay | Admitting: Licensed Clinical Social Worker

## 2022-06-18 NOTE — Patient Outreach (Signed)
  Care Coordination  Initial Visit Note   06/18/2022 Name: Benjamin Serrano MRN: [id number] DOB: 05/09/1956  Benjamin Serrano is a 67 y.o. year old male who sees Biagio Borg, MD for primary care. I spoke with  [name] by phone today.  What matters to the patients health and wellness today?   Assessed patient's needs, support system and barriers to care.  Reports doing much better with stress, not interested in connecting with a mental health provider at this time  Recommendation: Patient may benefit from, and is in agreement to : talk with LCSW to assist with coping skills to reduce his stress. .  Has strong faith and goal is to stay busy helping others   Goals Addressed             This Visit's Progress    Reduce stress       Activities in order to accomplish goals. Patient  ; Review attached educational information in your after visit summary Work on relaxed breathing          SDOH assessments and interventions completed:  No  Care Coordination Interventions:  Yes, provided  Interventions Today    Flowsheet Row Most Recent Value  General Interventions   General Interventions Discussed/Reviewed General Interventions Discussed  Education Interventions   Education Provided Provided Verbal Education, Provided Printed Education  Mental Health Interventions   Mental Health Discussed/Reviewed Mental Health Discussed, Coping Strategies  [relaxed breathing]       Follow up plan: Follow up call scheduled for 07/02/22    Encounter Outcome:  Pt. Visit Completed   Casimer Lanius, [place] [phone]

## 2022-06-18 NOTE — Patient Instructions (Signed)
Visit Information  Thank you for taking time to visit with me today. Please don't hesitate to contact me if I can be of assistance to you.   Following are the goals we discussed today:   Goals Addressed             This Visit's Progress    Reduce stress       Activities in order to accomplish goals. Patient  ; Review attached educational information in your after visit summary Work on relaxed breathing          Our next appointment is by telephone on 07/02/22 at 9:45  Please call the care guide team at [phone] if you need to cancel or reschedule your appointment.   If you are experiencing a Mental Health or [place] or need someone to talk to, please call the Suicide and Crisis Lifeline: 988 call the [place] National Suicide Prevention Lifeline: 4798463505 or TTY: 747-766-5389 TTY 515 546 8327) to talk to a trained counselor call 1-800-273-TALK (toll free, 24 hour hotline) go to [facility] [place] [phone])   The patient verbalized understanding of instructions, educational materials, and care plan provided today and agreed to receive a mailed copy of patient instructions, educational materials, and care plan.   Casimer Lanius, [place] [phone]

## 2022-07-02 ENCOUNTER — Ambulatory Visit: Payer: Self-pay | Admitting: Licensed Clinical Social Worker

## 2022-07-02 NOTE — Patient Outreach (Signed)
  Care Coordination  Follow Up Visit Note   07/02/2022 Name: Benjamin Serrano MRN: [id number] DOB: [birth year]  Benjamin Serrano is a 67 y.o. year old male who sees Benjamin Borg, MD for primary care. I spoke with  Benjamin Serrano by phone today.  What matters to the patients health and wellness today?    Patient is making progress with managing stress at home.  He has implemented relaxed breathing and reports "everything is going smooth right now" .  No new goals identified during this encounter    Goals Addressed             This Visit's Progress    COMPLETED: Reduce stress       Activities in order to accomplish goals. Patient  ; Call Department of Social Services [phone]  to follow up on concerns with your food stamp card Call your insurance provider for more information about your Enhanced Benefits (with your U-Card) Continue with doing Relaxed breathing 3 times daily         SDOH assessments and interventions completed:  No   Care Coordination Interventions:  Yes, provided  Interventions Today    Flowsheet Row Most Recent Value  Chronic Disease   Chronic disease during today's visit Diabetes, Hypertension (HTN), Other  [Depression/ Anxiety]  General Interventions   General Interventions Discussed/Reviewed General Interventions Reviewed, Radio producer Interventions   Education Provided Provided Education  Provided Verbal Education On [employer], [employer]  [employer] stamp barrier and enhanced benefits]  [place] Reviewed, Coping Strategies       Follow up plan: No further intervention required. Patient states he will contact if there are needs.  Encounter Outcome:  Pt. Visit Completed   Benjamin Serrano, [place] [phone]

## 2022-07-02 NOTE — Patient Instructions (Signed)
Visit Information  Thank you for taking time to visit with me today. Please don't hesitate to contact me if I can be of assistance to you.   Following are the goals we discussed today:   Goals Addressed             This Visit's Progress    COMPLETED: Reduce stress       Activities in order to accomplish goals. Patient  ; Call [place] [phone]  to follow up on concerns with your food stamp card Call your insurance provider for more information about your Enhanced Benefits (with your U-Card) Continue with doing Relaxed breathing 3 times daily         Please call the care guide team at [phone] if you need to cancel or reschedule your appointment.   If you are experiencing a Mental Health or [place] or need someone to talk to, please call the Suicide and Crisis Lifeline: 988 call the [place] National Suicide Prevention Lifeline: 570-101-8018 or TTY: 450-702-9068 TTY 226-651-0424) to talk to a trained counselor call 1-800-273-TALK (toll free, 24 hour hotline) go to [facility] [address] [place] [phone])   Patient verbalizes understanding of instructions and care plan provided today and agrees to view in [place]. Active MyChart status and patient understanding of how to access instructions and care plan via MyChart confirmed with patient.     No further follow up required: By Care Coordination, you will call if new needs arise    Casimer Lanius, [place] [phone]

## 2022-07-03 ENCOUNTER — Ambulatory Visit (INDEPENDENT_AMBULATORY_CARE_PROVIDER_SITE_OTHER): Payer: 59 | Admitting: Internal Medicine

## 2022-07-03 ENCOUNTER — Encounter: Payer: Self-pay | Admitting: Internal Medicine

## 2022-07-03 VITALS — BP 136/74 | HR 77 | Temp 98.1°F | Ht 66.0 in | Wt 205.0 lb

## 2022-07-03 DIAGNOSIS — N529 Male erectile dysfunction, unspecified: Secondary | ICD-10-CM | POA: Diagnosis not present

## 2022-07-03 DIAGNOSIS — I10 Essential (primary) hypertension: Secondary | ICD-10-CM | POA: Diagnosis not present

## 2022-07-03 DIAGNOSIS — Z23 Encounter for immunization: Secondary | ICD-10-CM

## 2022-07-03 DIAGNOSIS — Z720 Tobacco use: Secondary | ICD-10-CM

## 2022-07-03 DIAGNOSIS — N401 Enlarged prostate with lower urinary tract symptoms: Secondary | ICD-10-CM | POA: Diagnosis not present

## 2022-07-03 DIAGNOSIS — E538 Deficiency of other specified B group vitamins: Secondary | ICD-10-CM | POA: Diagnosis not present

## 2022-07-03 DIAGNOSIS — E559 Vitamin D deficiency, unspecified: Secondary | ICD-10-CM | POA: Diagnosis not present

## 2022-07-03 DIAGNOSIS — Z0001 Encounter for general adult medical examination with abnormal findings: Secondary | ICD-10-CM | POA: Diagnosis not present

## 2022-07-03 DIAGNOSIS — I700 Atherosclerosis of aorta: Secondary | ICD-10-CM

## 2022-07-03 DIAGNOSIS — R350 Frequency of micturition: Secondary | ICD-10-CM | POA: Diagnosis not present

## 2022-07-03 DIAGNOSIS — Z125 Encounter for screening for malignant neoplasm of prostate: Secondary | ICD-10-CM | POA: Diagnosis not present

## 2022-07-03 DIAGNOSIS — E1165 Type 2 diabetes mellitus with hyperglycemia: Secondary | ICD-10-CM

## 2022-07-03 LAB — LIPID PANEL
Cholesterol: 119 mg/dL (ref 0–200)
HDL: 44.3 mg/dL (ref >39.00)
LDL Cholesterol: 46 mg/dL (ref 0–99)
NonHDL: 74.34
Total CHOL/HDL Ratio: 3
Triglycerides: 143 mg/dL (ref 0.0–149.0)
VLDL: 28.6 mg/dL (ref 0.0–40.0)

## 2022-07-03 LAB — URINALYSIS, ROUTINE W REFLEX MICROSCOPIC
Bilirubin Urine: NEGATIVE
Hgb urine dipstick: NEGATIVE
Leukocytes,Ua: NEGATIVE
Nitrite: NEGATIVE
Specific Gravity, Urine: 1.02 (ref 1.000–1.030)
Total Protein, Urine: NEGATIVE
Urine Glucose: >=1000 — AB
Urobilinogen, UA: 0.2 (ref 0.0–1.0)
pH: 5.5 (ref 5.0–8.0)

## 2022-07-03 LAB — HEPATIC FUNCTION PANEL
ALT: 14 U/L (ref 0–53)
AST: 15 U/L (ref 0–37)
Albumin: 4.4 g/dL (ref 3.5–5.2)
Alkaline Phosphatase: 60 U/L (ref 39–117)
Bilirubin, Direct: 0.1 mg/dL (ref 0.0–0.3)
Total Bilirubin: 0.3 mg/dL (ref 0.2–1.2)
Total Protein: 6.9 g/dL (ref 6.0–8.3)

## 2022-07-03 LAB — VITAMIN B12: Vitamin B-12: 479 pg/mL (ref 211–911)

## 2022-07-03 LAB — MICROALBUMIN / CREATININE URINE RATIO
Creatinine,U: 123.9 mg/dL
Microalb Creat Ratio: 0.7 mg/g (ref 0.0–30.0)
Microalb, Ur: 0.9 mg/dL (ref 0.0–1.9)

## 2022-07-03 LAB — CBC WITH DIFFERENTIAL/PLATELET
Basophils Absolute: 0 10*3/uL (ref 0.0–0.1)
Basophils Relative: 0.5 % (ref 0.0–3.0)
Eosinophils Absolute: 0.1 10*3/uL (ref 0.0–0.7)
Eosinophils Relative: 2.2 % (ref 0.0–5.0)
HCT: 44.8 % (ref 39.0–52.0)
Hemoglobin: 14.8 g/dL (ref 13.0–17.0)
Lymphocytes Relative: 24.2 % (ref 12.0–46.0)
Lymphs Abs: 1.6 10*3/uL (ref 0.7–4.0)
MCHC: 33.1 g/dL (ref 30.0–36.0)
MCV: 91.9 fl (ref 78.0–100.0)
Monocytes Absolute: 0.7 10*3/uL (ref 0.1–1.0)
Monocytes Relative: 11.4 % (ref 3.0–12.0)
Neutro Abs: 4 10*3/uL (ref 1.4–7.7)
Neutrophils Relative %: 61.7 % (ref 43.0–77.0)
Platelets: 276 10*3/uL (ref 150.0–400.0)
RBC: 4.87 Mil/uL (ref 4.22–5.81)
RDW: 14.7 % (ref 11.5–15.5)
WBC: 6.5 10*3/uL (ref 4.0–10.5)

## 2022-07-03 LAB — BASIC METABOLIC PANEL
BUN: 19 mg/dL (ref 6–23)
CO2: 27 mEq/L (ref 19–32)
Calcium: 9.5 mg/dL (ref 8.4–10.5)
Chloride: 104 mEq/L (ref 96–112)
Creatinine, Ser: 1.27 mg/dL (ref 0.40–1.50)
GFR: 59.06 mL/min — ABNORMAL LOW (ref >60.00)
Glucose, Bld: 113 mg/dL — ABNORMAL HIGH (ref 70–99)
Potassium: 4.5 mEq/L (ref 3.5–5.1)
Sodium: 141 mEq/L (ref 135–145)

## 2022-07-03 LAB — VITAMIN D 25 HYDROXY (VIT D DEFICIENCY, FRACTURES): VITD: 39.01 ng/mL (ref 30.00–100.00)

## 2022-07-03 LAB — HEMOGLOBIN A1C: Hgb A1c MFr Bld: 6.9 % — ABNORMAL HIGH (ref 4.6–6.5)

## 2022-07-03 LAB — PSA: PSA: 5.98 ng/mL — ABNORMAL HIGH (ref 0.10–4.00)

## 2022-07-03 LAB — TSH: TSH: 0.5 u[IU]/mL (ref 0.35–5.50)

## 2022-07-03 MED ORDER — ALFUZOSIN HCL ER 10 MG PO TB24: 10.0000 mg | ORAL_TABLET | Freq: Every day | ORAL | 3 refills | Status: DC

## 2022-07-03 MED ORDER — SILDENAFIL CITRATE 100 MG PO TABS: 50.0000 mg | ORAL_TABLET | Freq: Every day | ORAL | 11 refills | Status: DC | PRN

## 2022-07-03 MED ORDER — FINASTERIDE 5 MG PO TABS: 5.0000 mg | ORAL_TABLET | Freq: Every day | ORAL | 3 refills | Status: DC

## 2022-07-03 NOTE — Patient Instructions (Addendum)
Please have your Shingrix (shingles) shots done at your local pharmacy.  You had the flu shot today  Please take all new medication as prescribed - the viagra  Please continue all other medications as before, and refills have been done if requested.  Please have the pharmacy call with any other refills you may need.  Please continue your efforts at being more active, low cholesterol diet, and weight control.  You are otherwise up to date with prevention measures today.  Please keep your appointments with your specialists as you may have planned  Please go to the LAB at the blood drawing area for the tests to be done  You will be contacted by phone if any changes need to be made immediately.  Otherwise, you will receive a letter about your results with an explanation, but please check with MyChart first.  Please remember to sign up for MyChart if you have not done so, as this will be important to you in the future with finding out test results, communicating by private email, and scheduling acute appointments online when needed.  Please make an Appointment to return in 6 months, or sooner if needed

## 2022-07-03 NOTE — Progress Notes (Unsigned)
Patient ID: Benjamin Serrano, male   DOB: 30-Nov-1955, 67 y.o.   MRN: [id number]         Chief Complaint:: wellness exam and worsening ED, BPH, aortic atherosclerosis, smoker, dm, low vit D       HPI:  Benjamin Serrano is a 67 y.o. male here for wellness exam; declines covid booster and pulm referral for LDCT screening, colonoscopy; but for flu shot today, and shingrix at pharmacy, o/w up to date.   Pt still smoking, not ready to quit.                          Also Pt denies chest pain, increased sob or doe, wheezing, orthopnea, PND, increased LE swelling, palpitations, dizziness or syncope.  Pt denies polydipsia, polyuria, or new focal neuro s/s.    Pt denies fever, wt loss, night sweats, loss of appetite, or other constitutional symptoms  Has slow urine stream and hesitancy, but mild and declines need for tx or urology.  Does have worsening ED symptoms, asks for viagra prn.     Wt Readings from Last 3 Encounters:  07/03/22 205 lb (93 kg)  02/20/22 207 lb (93.9 kg)  01/26/22 220 lb (99.8 kg)   BP Readings from Last 3 Encounters:  07/03/22 136/74  02/20/22 (!) 152/86  01/09/22 (!) 150/72   Immunization History  Administered Date(s) Administered   Fluad Quad(high Dose 65+) 06/27/2021, 07/03/2022   Influenza,inj,Quad PF,6+ Mos 05/30/2018, 06/20/2020   PFIZER(Purple Top)SARS-COV-2 Vaccination 09/04/2019, 09/20/2019, 04/22/2020, 12/12/2020   PNEUMOCOCCAL CONJUGATE-20 06/27/2021   Pneumococcal Polysaccharide-23 11/29/2017   Tdap 11/29/2017   There are no preventive care reminders to display for this patient.     Past Medical History:  Diagnosis Date   Anxiety 06/20/2020   Aortic atherosclerosis ([place]) 06/20/2020   Asthma    BPH (benign prostatic hyperplasia) 06/20/2020   Depression    Diabetes mellitus without complication ([place])    Dyslexia 06/20/2020   Essential hypertension 09/23/2016   Hyperlipemia    Hyperlipidemia 09/23/2016   Hypertension    Poor short-term memory 06/20/2020    Traumatic brain injury ([place]) 06/20/2020   Type 2 diabetes mellitus ([place]) 09/23/2016   Past Surgical History:  Procedure Laterality Date   Arm surgery Right    LEG SURGERY      reports that he has been smoking cigarettes. He has a 86.00 pack-year smoking history. He has never used smokeless tobacco. He reports current alcohol use. He reports that he does not use drugs. family history includes Alzheimer's disease in his maternal grandmother; Diabetes in his mother; Hypertension in his maternal grandmother. No Known Allergies Current Outpatient Medications on File Prior to Visit  Medication Sig Dispense Refill   amLODipine (NORVASC) 10 MG tablet Take 1 tablet (10 mg total) by mouth daily. 90 tablet 3   aspirin EC 81 MG tablet Take 81 mg by mouth daily.     buPROPion (WELLBUTRIN XL) 150 MG 24 hr tablet Take 1 tablet (150 mg total) by mouth daily. 90 tablet 3   dapagliflozin propanediol (FARXIGA) 10 MG TABS tablet Take 1 tablet (10 mg total) by mouth daily before breakfast. 90 tablet 3   glipiZIDE (GLUCOTROL XL) 10 MG 24 hr tablet Take 1 tablet (10 mg total) by mouth daily with breakfast. 90 tablet 3   glucose blood (ONETOUCH ULTRA) test strip Use as instructed twice per day E11.9 200 each 3   latanoprost (XALATAN) 0.005 % ophthalmic solution  1 drop at bedtime.     lidocaine-prilocaine (EMLA) cream Apply 1 Application topically as needed. To area 1 hour prior to injection and cover with saran wrap to numb the skin 30 g 12   LORazepam (ATIVAN) 0.5 MG tablet 1-2 tabs 30 - 60 min prior to injection. Do not drive with this medicine. 4 tablet 0   metFORMIN (GLUCOPHAGE) 1000 MG tablet Take 1 tablet (1,000 mg total) by mouth 2 (two) times daily with a meal. 180 tablet 3   ONETOUCH DELICA LANCETS 99991111 MISC Use to check blood sugars 2 times daily 100 each 5   rosuvastatin (CRESTOR) 20 MG tablet Take 1 tablet (20 mg total) by mouth daily. 90 tablet 3   varenicline (CHANTIX PAK) 0.5 MG X 11 & 1 MG X 42 tablet  Take one 0.5 mg tablet by mouth once daily for 3 days, then increase to one 0.5 mg tablet twice daily for 4 days, then increase to one 1 mg tablet twice daily. 53 tablet 0   varenicline (CHANTIX) 1 MG tablet Take 1 tablet by mouth twice daily 60 tablet 0   No current facility-administered medications on file prior to visit.        ROS:  All others reviewed and negative.  Objective        PE:  BP 136/74 (BP Location: Left Arm, Patient Position: Sitting, Cuff Size: Large)   Pulse 77   Temp 98.1 F (36.7 C) (Oral)   Ht 5' 6"$  (1.676 m)   Wt 205 lb (93 kg)   SpO2 95%   BMI 33.09 kg/m                 Constitutional: Pt appears in NAD               HENT: Head: NCAT.                Right Ear: External ear normal.                 Left Ear: External ear normal.                Eyes: . Pupils are equal, round, and reactive to light. Conjunctivae and EOM are normal               Nose: without d/c or deformity               Neck: Neck supple. Gross normal ROM               Cardiovascular: Normal rate and regular rhythm.                 Pulmonary/Chest: Effort normal and breath sounds without rales or wheezing.                Abd:  Soft, NT, ND, + BS, no organomegaly               Neurological: Pt is alert. At baseline orientation, motor grossly intact               Skin: Skin is warm. No rashes, no other new lesions, LE edema - none               Psychiatric: Pt behavior is normal without agitation   Micro: none  Cardiac tracings I have personally interpreted today:  none  Pertinent Radiological findings (summarize): none   Lab Results  Component Value Date   WBC 6.5 07/03/2022  HGB 14.8 07/03/2022   HCT 44.8 07/03/2022   PLT 276.0 07/03/2022   GLUCOSE 113 (H) 07/03/2022   CHOL 119 07/03/2022   TRIG 143.0 07/03/2022   HDL 44.30 07/03/2022   LDLCALC 46 07/03/2022   ALT 14 07/03/2022   AST 15 07/03/2022   NA 141 07/03/2022   K 4.5 07/03/2022   CL 104 07/03/2022   CREATININE  1.27 07/03/2022   BUN 19 07/03/2022   CO2 27 07/03/2022   TSH 0.50 07/03/2022   PSA 5.98 (H) 07/03/2022   HGBA1C 6.9 (H) 07/03/2022   MICROALBUR 0.9 07/03/2022   Assessment/Plan:  Benjamin Serrano is a 67 y.o. Black or African American [2] male with  has a past medical history of Anxiety (06/20/2020), Aortic atherosclerosis ([place]) (06/20/2020), Asthma, BPH (benign prostatic hyperplasia) (06/20/2020), Depression, Diabetes mellitus without complication ([place]), Dyslexia (06/20/2020), Essential hypertension (09/23/2016), Hyperlipemia, Hyperlipidemia (09/23/2016), Hypertension, Poor short-term memory (06/20/2020), Traumatic brain injury ([place]) (06/20/2020), and Type 2 diabetes mellitus ([place]) (09/23/2016).  Encounter for well adult exam with abnormal findings Age and sex appropriate education and counseling updated with regular exercise and diet Referrals for preventative services - declines pulm referral for LDCT screening,  Immunizations addressed - declines covid boostser, for shingrix at pharmacy, for flu shot today Smoking counseling  - pt counsled to quit, pt not ready Evidence for depression or other mood disorder - none significant Most recent labs reviewed. I have personally reviewed and have noted: 1) the patient's medical and social history 2) The patient's current medications and supplements 3) The patient's height, weight, and BMI have been recorded in the chart   Aortic atherosclerosis ([facility]) Pt to continue low chol diet, exercise, crestor 20 mg qd  BPH (benign prostatic hyperplasia) Mild stable, for contd uroxatrol, f/u encouraged to f/u with urology as planned  Essential hypertension BP Readings from Last 3 Encounters:  07/03/22 136/74  02/20/22 (!) 152/86  01/09/22 (!) 150/72   Stable, pt to continue medical treatment norvasc 10 mg qd   Tobacco abuse Pt counsled to quit, pt not ready  Type 2 diabetes mellitus ([place]) Lab Results  Component Value Date   HGBA1C 6.9 (H) 07/03/2022    Stable, pt to continue current medical treatment farxiga 10 qd, glucotrol xl 10 mg qd, metformin 1000 bid   Vitamin D deficiency Last vitamin D Lab Results  Component Value Date   VD25OH 39.01 07/03/2022   Low, to start oral replacement   Erectile dysfunction Recent worsening, for viagra prn,  to f/u any worsening symptoms or concerns  Followup: Return in about 6 months (around 01/01/2023).  Cathlean Cower, MD 07/05/2022 6:41 AM [facility]

## 2022-07-05 ENCOUNTER — Encounter: Payer: Self-pay | Admitting: Internal Medicine

## 2022-07-05 NOTE — Assessment & Plan Note (Addendum)
Age and sex appropriate education and counseling updated with regular exercise and diet Referrals for preventative services - declines pulm referral for LDCT screening,  Immunizations addressed - declines covid boostser, for shingrix at pharmacy, for flu shot today Smoking counseling  - pt counsled to quit, pt not ready Evidence for depression or other mood disorder - none significant Most recent labs reviewed. I have personally reviewed and have noted: 1) the patient's medical and social history 2) The patient's current medications and supplements 3) The patient's height, weight, and BMI have been recorded in the chart

## 2022-07-05 NOTE — Assessment & Plan Note (Signed)
Mild stable, for contd uroxatrol, f/u encouraged to f/u with urology as planned

## 2022-07-05 NOTE — Assessment & Plan Note (Signed)
Last vitamin D Lab Results  Component Value Date   VD25OH 39.01 07/03/2022   Low, to start oral replacement

## 2022-07-05 NOTE — Assessment & Plan Note (Signed)
Lab Results  Component Value Date   HGBA1C 6.9 (H) 07/03/2022   Stable, pt to continue current medical treatment farxiga 10 qd, glucotrol xl 10 mg qd, metformin 1000 bid

## 2022-07-05 NOTE — Assessment & Plan Note (Signed)
BP Readings from Last 3 Encounters:  07/03/22 136/74  02/20/22 (!) 152/86  01/09/22 (!) 150/72   Stable, pt to continue medical treatment norvasc 10 mg qd

## 2022-07-05 NOTE — Assessment & Plan Note (Signed)
Recent worsening, for viagra prn,  to f/u any worsening symptoms or concerns

## 2022-07-05 NOTE — Assessment & Plan Note (Signed)
Pt to continue low chol diet, exercise, crestor 20 mg qd

## 2022-07-05 NOTE — Assessment & Plan Note (Signed)
Pt counsled to quit, pt not ready °

## 2022-07-07 ENCOUNTER — Other Ambulatory Visit: Payer: Self-pay | Admitting: Internal Medicine

## 2022-07-07 DIAGNOSIS — R972 Elevated prostate specific antigen [PSA]: Secondary | ICD-10-CM

## 2022-07-07 DIAGNOSIS — R8281 Pyuria: Secondary | ICD-10-CM

## 2022-07-07 MED ORDER — DOXYCYCLINE HYCLATE 100 MG PO TABS: 100.0000 mg | ORAL_TABLET | Freq: Two times a day (BID) | ORAL | 0 refills | Status: DC

## 2022-07-08 ENCOUNTER — Other Ambulatory Visit: Payer: Self-pay | Admitting: Internal Medicine

## 2022-07-08 MED ORDER — DOXYCYCLINE HYCLATE 100 MG PO TABS: 100.0000 mg | ORAL_TABLET | Freq: Two times a day (BID) | ORAL | 0 refills | Status: DC

## 2022-07-24 ENCOUNTER — Other Ambulatory Visit: Payer: 59

## 2022-07-28 ENCOUNTER — Other Ambulatory Visit (INDEPENDENT_AMBULATORY_CARE_PROVIDER_SITE_OTHER): Payer: 59

## 2022-07-28 DIAGNOSIS — R972 Elevated prostate specific antigen [PSA]: Secondary | ICD-10-CM

## 2022-07-28 DIAGNOSIS — R8281 Pyuria: Secondary | ICD-10-CM

## 2022-07-28 LAB — URINALYSIS, ROUTINE W REFLEX MICROSCOPIC
Bilirubin Urine: NEGATIVE
Hgb urine dipstick: NEGATIVE
Ketones, ur: NEGATIVE
Leukocytes,Ua: NEGATIVE
Nitrite: NEGATIVE
RBC / HPF: NONE SEEN (ref 0–?)
Specific Gravity, Urine: 1.02 (ref 1.000–1.030)
Total Protein, Urine: NEGATIVE
Urine Glucose: >=1000 — AB
Urobilinogen, UA: 0.2 (ref 0.0–1.0)
pH: 6 (ref 5.0–8.0)

## 2022-07-28 LAB — PSA: PSA: 3.42 ng/mL (ref 0.10–4.00)

## 2022-09-28 ENCOUNTER — Other Ambulatory Visit: Payer: Self-pay

## 2022-09-28 ENCOUNTER — Telehealth: Payer: Self-pay | Admitting: Internal Medicine

## 2022-09-28 DIAGNOSIS — E119 Type 2 diabetes mellitus without complications: Secondary | ICD-10-CM

## 2022-09-28 MED ORDER — ONETOUCH ULTRA VI STRP
ORAL_STRIP | 3 refills | Status: DC
Start: 2022-09-28 — End: 2023-08-06

## 2022-09-28 NOTE — Telephone Encounter (Signed)
Refill sent.

## 2022-09-28 NOTE — Telephone Encounter (Signed)
Prescription Request  09/28/2022  LOV: 07/03/2022  What is the name of the medication or equipment? One test ultra test strips  Have you contacted your pharmacy to request a refill? Yes   Which pharmacy would you like this sent to?   [facility] - [place], [place] - [address] [place] [address] Phone: [phone] Fax: [phone]    Patient notified that their request is being sent to the clinical staff for review and that they should receive a response within 2 business days.   Please advise at Mobile [phone] (mobile)

## 2022-09-29 DIAGNOSIS — H2513 Age-related nuclear cataract, bilateral: Secondary | ICD-10-CM | POA: Diagnosis not present

## 2022-09-29 DIAGNOSIS — H04123 Dry eye syndrome of bilateral lacrimal glands: Secondary | ICD-10-CM | POA: Diagnosis not present

## 2022-09-29 DIAGNOSIS — H40013 Open angle with borderline findings, low risk, bilateral: Secondary | ICD-10-CM | POA: Diagnosis not present

## 2022-09-29 DIAGNOSIS — E119 Type 2 diabetes mellitus without complications: Secondary | ICD-10-CM | POA: Diagnosis not present

## 2022-12-16 ENCOUNTER — Encounter (INDEPENDENT_AMBULATORY_CARE_PROVIDER_SITE_OTHER): Payer: Self-pay

## 2023-01-01 ENCOUNTER — Encounter: Payer: Self-pay | Admitting: Internal Medicine

## 2023-01-01 ENCOUNTER — Ambulatory Visit (INDEPENDENT_AMBULATORY_CARE_PROVIDER_SITE_OTHER): Payer: 59 | Admitting: Internal Medicine

## 2023-01-01 VITALS — BP 122/76 | HR 82 | Temp 98.6°F | Ht 66.0 in | Wt 192.0 lb

## 2023-01-01 DIAGNOSIS — E782 Mixed hyperlipidemia: Secondary | ICD-10-CM | POA: Diagnosis not present

## 2023-01-01 DIAGNOSIS — Z7984 Long term (current) use of oral hypoglycemic drugs: Secondary | ICD-10-CM | POA: Diagnosis not present

## 2023-01-01 DIAGNOSIS — E538 Deficiency of other specified B group vitamins: Secondary | ICD-10-CM | POA: Diagnosis not present

## 2023-01-01 DIAGNOSIS — I10 Essential (primary) hypertension: Secondary | ICD-10-CM

## 2023-01-01 DIAGNOSIS — Z125 Encounter for screening for malignant neoplasm of prostate: Secondary | ICD-10-CM | POA: Diagnosis not present

## 2023-01-01 DIAGNOSIS — E559 Vitamin D deficiency, unspecified: Secondary | ICD-10-CM

## 2023-01-01 DIAGNOSIS — E119 Type 2 diabetes mellitus without complications: Secondary | ICD-10-CM | POA: Diagnosis not present

## 2023-01-01 DIAGNOSIS — N529 Male erectile dysfunction, unspecified: Secondary | ICD-10-CM

## 2023-01-01 DIAGNOSIS — J069 Acute upper respiratory infection, unspecified: Secondary | ICD-10-CM | POA: Diagnosis not present

## 2023-01-01 LAB — PSA: PSA: 10.45 ng/mL — ABNORMAL HIGH (ref 0.10–4.00)

## 2023-01-01 LAB — CBC WITH DIFFERENTIAL/PLATELET
Basophils Absolute: 0 10*3/uL (ref 0.0–0.1)
Basophils Relative: 0.5 % (ref 0.0–3.0)
Eosinophils Absolute: 0 10*3/uL (ref 0.0–0.7)
Eosinophils Relative: 0.4 % (ref 0.0–5.0)
HCT: 43.7 % (ref 39.0–52.0)
Hemoglobin: 14.1 g/dL (ref 13.0–17.0)
Lymphocytes Relative: 23.6 % (ref 12.0–46.0)
Lymphs Abs: 1.4 10*3/uL (ref 0.7–4.0)
MCHC: 32.4 g/dL (ref 30.0–36.0)
MCV: 91.4 fl (ref 78.0–100.0)
Monocytes Absolute: 0.7 10*3/uL (ref 0.1–1.0)
Monocytes Relative: 11.6 % (ref 3.0–12.0)
Neutro Abs: 3.9 10*3/uL (ref 1.4–7.7)
Neutrophils Relative %: 63.9 % (ref 43.0–77.0)
Platelets: 243 10*3/uL (ref 150.0–400.0)
RBC: 4.78 Mil/uL (ref 4.22–5.81)
RDW: 14.4 % (ref 11.5–15.5)
WBC: 6.1 10*3/uL (ref 4.0–10.5)

## 2023-01-01 LAB — URINALYSIS, ROUTINE W REFLEX MICROSCOPIC
Bilirubin Urine: NEGATIVE
Hgb urine dipstick: NEGATIVE
Ketones, ur: 15 — AB
Leukocytes,Ua: NEGATIVE
Nitrite: NEGATIVE
RBC / HPF: NONE SEEN (ref 0–?)
Specific Gravity, Urine: >=1.03 — AB (ref 1.000–1.030)
Urine Glucose: >=1000 — AB
Urobilinogen, UA: 0.2 (ref 0.0–1.0)
pH: 5.5 (ref 5.0–8.0)

## 2023-01-01 LAB — LIPID PANEL
Cholesterol: 116 mg/dL (ref 0–200)
HDL: 41.5 mg/dL (ref >39.00)
LDL Cholesterol: 59 mg/dL (ref 0–99)
NonHDL: 74.17
Total CHOL/HDL Ratio: 3
Triglycerides: 78 mg/dL (ref 0.0–149.0)
VLDL: 15.6 mg/dL (ref 0.0–40.0)

## 2023-01-01 LAB — BASIC METABOLIC PANEL
BUN: 20 mg/dL (ref 6–23)
CO2: 26 meq/L (ref 19–32)
Calcium: 9.1 mg/dL (ref 8.4–10.5)
Chloride: 99 meq/L (ref 96–112)
Creatinine, Ser: 1.22 mg/dL (ref 0.40–1.50)
GFR: 61.76 mL/min (ref >60.00)
Glucose, Bld: 80 mg/dL (ref 70–99)
Potassium: 3.8 meq/L (ref 3.5–5.1)
Sodium: 136 meq/L (ref 135–145)

## 2023-01-01 LAB — HEPATIC FUNCTION PANEL
ALT: 37 U/L (ref 0–53)
AST: 26 U/L (ref 0–37)
Albumin: 4.4 g/dL (ref 3.5–5.2)
Alkaline Phosphatase: 58 U/L (ref 39–117)
Bilirubin, Direct: 0.1 mg/dL (ref 0.0–0.3)
Total Bilirubin: 0.5 mg/dL (ref 0.2–1.2)
Total Protein: 6.9 g/dL (ref 6.0–8.3)

## 2023-01-01 LAB — VITAMIN D 25 HYDROXY (VIT D DEFICIENCY, FRACTURES): VITD: 50.63 ng/mL (ref 30.00–100.00)

## 2023-01-01 LAB — MICROALBUMIN / CREATININE URINE RATIO
Creatinine,U: 189.2 mg/dL
Microalb Creat Ratio: 3.5 mg/g (ref 0.0–30.0)
Microalb, Ur: 6.6 mg/dL — ABNORMAL HIGH (ref 0.0–1.9)

## 2023-01-01 LAB — VITAMIN B12: Vitamin B-12: 956 pg/mL — ABNORMAL HIGH (ref 211–911)

## 2023-01-01 LAB — TSH: TSH: 0.43 u[IU]/mL (ref 0.35–5.50)

## 2023-01-01 MED ORDER — SILDENAFIL CITRATE 100 MG PO TABS: 50.0000 mg | ORAL_TABLET | Freq: Every day | ORAL | 11 refills | Status: DC | PRN

## 2023-01-01 NOTE — Patient Instructions (Signed)
Please take all new medication as prescribed - the viagra as needed  Please continue all other medications as before, and refills have been done if requested.  Please have the pharmacy call with any other refills you may need.  Please continue your efforts at being more active, low cholesterol diet, and weight control.  Please keep your appointments with your specialists as you may have planned  Please go to the LAB at the blood drawing area for the tests to be done  You will be contacted by phone if any changes need to be made immediately.  Otherwise, you will receive a letter about your results with an explanation, but please check with MyChart first.  Please remember to sign up for MyChart if you have not done so, as this will be important to you in the future with finding out test results, communicating by private email, and scheduling acute appointments online when needed.  Please make an Appointment to return in 6 months, or sooner if needed, also with Lab Appointment for testing done 3-5 days before at the FIRST FLOOR Lab (so this is for TWO appointments - please see the scheduling desk as you leave)

## 2023-01-01 NOTE — Progress Notes (Unsigned)
Patient ID: Benjamin Serrano, male   DOB: July 11, 1955, 67 y.o.   MRN: [id number]        Chief Complaint: follow up URI, worsening ED, dm, low vit d, htl, htn       HPI:  Benjamin Serrano is a 67 y.o. male  Here with 2-3 days acute onset fever, facial pain, pressure, headache, general weakness and malaise, and clear d/c, with mild ST and cough, but pt denies chest pain, wheezing, increased sob or doe, orthopnea, PND, increased LE swelling, palpitations, dizziness or syncope.   Pt denies polydipsia, polyuria, or new focal neuro s/s.    Pt denies fever, wt loss, night sweats, loss of appetite, or other constitutional symptoms   Lost over 10 lbs with better diet.  Still smoking, not ready to quit.    Wt Readings from Last 3 Encounters:  01/01/23 192 lb (87.1 kg)  07/03/22 205 lb (93 kg)  02/20/22 207 lb (93.9 kg)   BP Readings from Last 3 Encounters:  01/01/23 122/76  07/03/22 136/74  02/20/22 (!) 152/86         Past Medical History:  Diagnosis Date   Anxiety 06/20/2020   Aortic atherosclerosis (HCC) 06/20/2020   Asthma    BPH (benign prostatic hyperplasia) 06/20/2020   Depression    Diabetes mellitus without complication (HCC)    Dyslexia 06/20/2020   Essential hypertension 09/23/2016   Hyperlipemia    Hyperlipidemia 09/23/2016   Hypertension    Poor short-term memory 06/20/2020   Traumatic brain injury (HCC) 06/20/2020   Type 2 diabetes mellitus (HCC) 09/23/2016   Past Surgical History:  Procedure Laterality Date   Arm surgery Right    LEG SURGERY      reports that he has been smoking cigarettes. He has a 86 pack-year smoking history. He has never used smokeless tobacco. He reports current alcohol use. He reports that he does not use drugs. family history includes Alzheimer's disease in his maternal grandmother; Diabetes in his mother; Hypertension in his maternal grandmother. No Known Allergies Current Outpatient Medications on File Prior to Visit  Medication Sig Dispense Refill   alfuzosin  (UROXATRAL) 10 MG 24 hr tablet Take 1 tablet (10 mg total) by mouth at bedtime. 90 tablet 3   amLODipine (NORVASC) 10 MG tablet Take 1 tablet (10 mg total) by mouth daily. 90 tablet 3   aspirin EC 81 MG tablet Take 81 mg by mouth daily.     buPROPion (WELLBUTRIN XL) 150 MG 24 hr tablet Take 1 tablet (150 mg total) by mouth daily. 90 tablet 3   dapagliflozin propanediol (FARXIGA) 10 MG TABS tablet Take 1 tablet (10 mg total) by mouth daily before breakfast. 90 tablet 3   doxycycline (VIBRA-TABS) 100 MG tablet Take 1 tablet (100 mg total) by mouth 2 (two) times daily. 20 tablet 0   finasteride (PROSCAR) 5 MG tablet Take 1 tablet (5 mg total) by mouth daily. 90 tablet 3   glipiZIDE (GLUCOTROL XL) 10 MG 24 hr tablet Take 1 tablet (10 mg total) by mouth daily with breakfast. 90 tablet 3   glucose blood (ONETOUCH ULTRA) test strip Use as instructed twice per day E11.9 200 each 3   latanoprost (XALATAN) 0.005 % ophthalmic solution 1 drop at bedtime.     lidocaine-prilocaine (EMLA) cream Apply 1 Application topically as needed. To area 1 hour prior to injection and cover with saran wrap to numb the skin 30 g 12   LORazepam (ATIVAN) 0.5 MG tablet  1-2 tabs 30 - 60 min prior to injection. Do not drive with this medicine. 4 tablet 0   metFORMIN (GLUCOPHAGE) 1000 MG tablet Take 1 tablet (1,000 mg total) by mouth 2 (two) times daily with a meal. 180 tablet 3   ONETOUCH DELICA LANCETS 33G MISC Use to check blood sugars 2 times daily 100 each 5   rosuvastatin (CRESTOR) 20 MG tablet Take 1 tablet (20 mg total) by mouth daily. 90 tablet 3   varenicline (CHANTIX PAK) 0.5 MG X 11 & 1 MG X 42 tablet Take one 0.5 mg tablet by mouth once daily for 3 days, then increase to one 0.5 mg tablet twice daily for 4 days, then increase to one 1 mg tablet twice daily. 53 tablet 0   varenicline (CHANTIX) 1 MG tablet Take 1 tablet by mouth twice daily 60 tablet 0   No current facility-administered medications on file prior to visit.         ROS:  All others reviewed and negative.  Objective        PE:  BP 122/76 (BP Location: Right Arm, Patient Position: Sitting, Cuff Size: Normal)   Pulse 82   Temp 98.6 F (37 C) (Oral)   Ht 5\' 6"  (1.676 m)   Wt 192 lb (87.1 kg)   SpO2 97%   BMI 30.99 kg/m                 Constitutional: Pt appears in NAD, mil dill               HENT: Head: NCAT.                Right Ear: External ear normal.                 Left Ear: External ear normal. Bilat tm's with mild erythema.  Max sinus areas mild tender.  Pharynx with mild erythema, no exudate                Eyes: . Pupils are equal, round, and reactive to light. Conjunctivae and EOM are normal               Nose: without d/c or deformity               Neck: Neck supple. Gross normal ROM               Cardiovascular: Normal rate and regular rhythm.                 Pulmonary/Chest: Effort normal and breath sounds without rales or wheezing.                Abd:  Soft, NT, ND, + BS, no organomegaly               Neurological: Pt is alert. At baseline orientation, motor grossly intact               Skin: Skin is warm. No rashes, no other new lesions, LE edema - none               Psychiatric: Pt behavior is normal without agitation   Micro: none  Cardiac tracings I have personally interpreted today:  none  Pertinent Radiological findings (summarize): none   Lab Results  Component Value Date   WBC 6.1 01/01/2023   HGB 14.1 01/01/2023   HCT 43.7 01/01/2023   PLT 243.0 01/01/2023   GLUCOSE 80 01/01/2023   CHOL 116  01/01/2023   TRIG 78.0 01/01/2023   HDL 41.50 01/01/2023   LDLCALC 59 01/01/2023   ALT 37 01/01/2023   AST 26 01/01/2023   NA 136 01/01/2023   K 3.8 01/01/2023   CL 99 01/01/2023   CREATININE 1.22 01/01/2023   BUN 20 01/01/2023   CO2 26 01/01/2023   TSH 0.43 01/01/2023   PSA 10.45 (H) 01/01/2023   HGBA1C 7.4 Repeated and verified X2. (H) 01/01/2023   MICROALBUR 6.6 (H) 01/01/2023   Assessment/Plan:   SABEN STUP is a 67 y.o. Black or African American [2] male with  has a past medical history of Anxiety (06/20/2020), Aortic atherosclerosis (HCC) (06/20/2020), Asthma, BPH (benign prostatic hyperplasia) (06/20/2020), Depression, Diabetes mellitus without complication (HCC), Dyslexia (06/20/2020), Essential hypertension (09/23/2016), Hyperlipemia, Hyperlipidemia (09/23/2016), Hypertension, Poor short-term memory (06/20/2020), Traumatic brain injury (HCC) (06/20/2020), and Type 2 diabetes mellitus (HCC) (09/23/2016).  Type 2 diabetes mellitus (HCC) Lab Results  Component Value Date   HGBA1C 7.4 Repeated and verified X2. (H) 01/01/2023   Uncontrolled, goal A1c < 7 pt to continue current medical treatment farxiga 10 every day, glucotrol xl 10 every day, metformn 1000 bid - declines change for now   Erectile dysfunction Worsening recently, for viagra prn  Vitamin D deficiency Last vitamin D Lab Results  Component Value Date   VD25OH 50.63 01/01/2023   Stable, cont oral replacement   Hyperlipidemia Lab Results  Component Value Date   LDLCALC 59 01/01/2023   Stable, pt to continue current statin crestor 20 qd   Essential hypertension BP Readings from Last 3 Encounters:  01/01/23 122/76  07/03/22 136/74  02/20/22 (!) 152/86   Stable, pt to continue medical treatment norvasc 10 qd   URI (upper respiratory infection) C/w viral infection , fo rmucinex biid prn  Followup: Return in about 6 months (around 07/04/2023).  Oliver Barre, MD 01/05/2023 9:01 PM [facility]

## 2023-01-04 ENCOUNTER — Other Ambulatory Visit: Payer: Self-pay | Admitting: Internal Medicine

## 2023-01-04 DIAGNOSIS — R972 Elevated prostate specific antigen [PSA]: Secondary | ICD-10-CM

## 2023-01-04 LAB — HEMOGLOBIN A1C: Hgb A1c MFr Bld: 7.4 % — ABNORMAL HIGH (ref 4.6–6.5)

## 2023-01-04 MED ORDER — DOXYCYCLINE HYCLATE 100 MG PO TABS: 100.0000 mg | ORAL_TABLET | Freq: Two times a day (BID) | ORAL | 0 refills | Status: DC

## 2023-01-05 ENCOUNTER — Encounter: Payer: Self-pay | Admitting: Internal Medicine

## 2023-01-05 DIAGNOSIS — J069 Acute upper respiratory infection, unspecified: Secondary | ICD-10-CM | POA: Insufficient documentation

## 2023-01-05 NOTE — Assessment & Plan Note (Signed)
Last vitamin D Lab Results  Component Value Date   VD25OH 50.63 01/01/2023   Stable, cont oral replacement

## 2023-01-05 NOTE — Assessment & Plan Note (Signed)
Worsening recently, for viagra prn

## 2023-01-05 NOTE — Assessment & Plan Note (Signed)
BP Readings from Last 3 Encounters:  01/01/23 122/76  07/03/22 136/74  02/20/22 (!) 152/86   Stable, pt to continue medical treatment norvasc 10 qd

## 2023-01-05 NOTE — Assessment & Plan Note (Signed)
Lab Results  Component Value Date   LDLCALC 59 01/01/2023   Stable, pt to continue current statin crestor 20 qd

## 2023-01-05 NOTE — Assessment & Plan Note (Signed)
C/w viral infection , fo rmucinex biid prn

## 2023-01-05 NOTE — Assessment & Plan Note (Signed)
Lab Results  Component Value Date   HGBA1C 7.4 Repeated and verified X2. (H) 01/01/2023   Uncontrolled, goal A1c < 7 pt to continue current medical treatment farxiga 10 every day, glucotrol xl 10 every day, metformn 1000 bid - declines change for now

## 2023-01-21 ENCOUNTER — Telehealth: Payer: Self-pay | Admitting: Internal Medicine

## 2023-01-21 ENCOUNTER — Other Ambulatory Visit: Payer: Self-pay

## 2023-01-21 MED ORDER — VARENICLINE TARTRATE 1 MG PO TABS: 1.0000 mg | ORAL_TABLET | Freq: Two times a day (BID) | ORAL | 0 refills | Status: DC

## 2023-01-21 MED ORDER — ONETOUCH DELICA LANCETS 33G MISC: 5 refills | Status: AC

## 2023-01-21 NOTE — Telephone Encounter (Signed)
Refill sent.

## 2023-01-21 NOTE — Telephone Encounter (Signed)
Prescription Request  01/21/2023  LOV: 01/01/2023  What is the name of the medication or equipment? varenicline (CHANTIX) 1 MG tablet  ONETOUCH DELICA LANCETS 33G MISC  aspirin EC 81 MG tablet  Have you contacted your pharmacy to request a refill? No   Which pharmacy would you like this sent to?   [facility]  [address], [place], [place] [address] .Marland KitchenMarland Kitchen [phone] option 2  Patient notified that their request is being sent to the clinical staff for review and that they should receive a response within 2 business days.   Please advise at Mobile [phone] (mobile)

## 2023-01-25 ENCOUNTER — Other Ambulatory Visit: Payer: Self-pay | Admitting: Internal Medicine

## 2023-02-01 ENCOUNTER — Other Ambulatory Visit (INDEPENDENT_AMBULATORY_CARE_PROVIDER_SITE_OTHER): Payer: 59

## 2023-02-01 DIAGNOSIS — E538 Deficiency of other specified B group vitamins: Secondary | ICD-10-CM | POA: Diagnosis not present

## 2023-02-01 DIAGNOSIS — E559 Vitamin D deficiency, unspecified: Secondary | ICD-10-CM | POA: Diagnosis not present

## 2023-02-01 DIAGNOSIS — E119 Type 2 diabetes mellitus without complications: Secondary | ICD-10-CM

## 2023-02-01 DIAGNOSIS — Z125 Encounter for screening for malignant neoplasm of prostate: Secondary | ICD-10-CM

## 2023-02-01 LAB — URINALYSIS, ROUTINE W REFLEX MICROSCOPIC
Bilirubin Urine: NEGATIVE
Hgb urine dipstick: NEGATIVE
Ketones, ur: NEGATIVE
Leukocytes,Ua: NEGATIVE
Nitrite: NEGATIVE
Specific Gravity, Urine: 1.01 (ref 1.000–1.030)
Total Protein, Urine: NEGATIVE
Urine Glucose: >=1000 — AB
Urobilinogen, UA: 0.2 (ref 0.0–1.0)
pH: 7.5 (ref 5.0–8.0)

## 2023-02-01 LAB — CBC WITH DIFFERENTIAL/PLATELET
Basophils Absolute: 0 10*3/uL (ref 0.0–0.1)
Basophils Relative: 0.4 % (ref 0.0–3.0)
Eosinophils Absolute: 0.1 10*3/uL (ref 0.0–0.7)
Eosinophils Relative: 3.1 % (ref 0.0–5.0)
HCT: 43.7 % (ref 39.0–52.0)
Hemoglobin: 14.3 g/dL (ref 13.0–17.0)
Lymphocytes Relative: 28 % (ref 12.0–46.0)
Lymphs Abs: 1.3 10*3/uL (ref 0.7–4.0)
MCHC: 32.6 g/dL (ref 30.0–36.0)
MCV: 91.6 fl (ref 78.0–100.0)
Monocytes Absolute: 0.5 10*3/uL (ref 0.1–1.0)
Monocytes Relative: 10.4 % (ref 3.0–12.0)
Neutro Abs: 2.8 10*3/uL (ref 1.4–7.7)
Neutrophils Relative %: 58.1 % (ref 43.0–77.0)
Platelets: 283 10*3/uL (ref 150.0–400.0)
RBC: 4.77 Mil/uL (ref 4.22–5.81)
RDW: 14.2 % (ref 11.5–15.5)
WBC: 4.7 10*3/uL (ref 4.0–10.5)

## 2023-02-01 LAB — VITAMIN D 25 HYDROXY (VIT D DEFICIENCY, FRACTURES): VITD: 47.62 ng/mL (ref 30.00–100.00)

## 2023-02-01 LAB — TSH: TSH: 0.99 u[IU]/mL (ref 0.35–5.50)

## 2023-02-01 LAB — LIPID PANEL
Cholesterol: 131 mg/dL (ref 0–200)
HDL: 48.5 mg/dL (ref >39.00)
LDL Cholesterol: 57 mg/dL (ref 0–99)
NonHDL: 82.39
Total CHOL/HDL Ratio: 3
Triglycerides: 126 mg/dL (ref 0.0–149.0)
VLDL: 25.2 mg/dL (ref 0.0–40.0)

## 2023-02-01 LAB — HEPATIC FUNCTION PANEL
ALT: 14 U/L (ref 0–53)
AST: 14 U/L (ref 0–37)
Albumin: 4.1 g/dL (ref 3.5–5.2)
Alkaline Phosphatase: 55 U/L (ref 39–117)
Bilirubin, Direct: 0.1 mg/dL (ref 0.0–0.3)
Total Bilirubin: 0.3 mg/dL (ref 0.2–1.2)
Total Protein: 6.5 g/dL (ref 6.0–8.3)

## 2023-02-01 LAB — BASIC METABOLIC PANEL
BUN: 18 mg/dL (ref 6–23)
CO2: 28 meq/L (ref 19–32)
Calcium: 9.3 mg/dL (ref 8.4–10.5)
Chloride: 104 meq/L (ref 96–112)
Creatinine, Ser: 1.2 mg/dL (ref 0.40–1.50)
GFR: 62.96 mL/min (ref >60.00)
Glucose, Bld: 74 mg/dL (ref 70–99)
Potassium: 4.3 meq/L (ref 3.5–5.1)
Sodium: 142 meq/L (ref 135–145)

## 2023-02-01 LAB — MICROALBUMIN / CREATININE URINE RATIO
Creatinine,U: 105.1 mg/dL
Microalb Creat Ratio: 0.7 mg/g (ref 0.0–30.0)
Microalb, Ur: <0.7 mg/dL (ref 0.0–1.9)

## 2023-02-01 LAB — PSA: PSA: 3.91 ng/mL (ref 0.10–4.00)

## 2023-02-01 LAB — HEMOGLOBIN A1C: Hgb A1c MFr Bld: 7.2 % — ABNORMAL HIGH (ref 4.6–6.5)

## 2023-02-01 LAB — VITAMIN B12: Vitamin B-12: 574 pg/mL (ref 211–911)

## 2023-02-01 NOTE — Progress Notes (Signed)
The test results show that your current treatment is OK, as the tests are stable.  Please continue the same plan.  There is no other need for change of treatment or further evaluation based on these results, at this time.  thanks

## 2023-02-11 ENCOUNTER — Telehealth: Payer: Self-pay

## 2023-02-11 ENCOUNTER — Encounter: Payer: Self-pay | Admitting: Internal Medicine

## 2023-02-11 NOTE — Telephone Encounter (Signed)
Unsuccessful attempt to reach patient on preferred number listed in notes for scheduled AWV. Unable to leave voice message.

## 2023-02-23 ENCOUNTER — Ambulatory Visit (INDEPENDENT_AMBULATORY_CARE_PROVIDER_SITE_OTHER): Payer: 59

## 2023-02-23 VITALS — Ht 66.0 in | Wt 200.0 lb

## 2023-02-23 DIAGNOSIS — Z0000 Encounter for general adult medical examination without abnormal findings: Secondary | ICD-10-CM | POA: Diagnosis not present

## 2023-02-23 NOTE — Progress Notes (Signed)
Subjective:   Benjamin Serrano is a 67 y.o. male who presents for Medicare Annual/Subsequent preventive examination.  Visit Complete: Virtual I connected with  Benjamin Serrano on 02/23/23 by a audio enabled telemedicine application and verified that I am speaking with the correct person using two identifiers.  Patient Location: Home  Provider Location: Office/Clinic  I discussed the limitations of evaluation and management by telemedicine. The patient expressed understanding and agreed to proceed.  Vital Signs: Because this visit was a virtual/telehealth visit, some criteria may be missing or patient reported. Any vitals not documented were not able to be obtained and vitals that have been documented are patient reported.  Patient Medicare AWV questionnaire was completed by the patient on 02/22/2023; I have confirmed that all information answered by patient is correct and no changes since this date.  Cardiac Risk Factors include: advanced age (>55men, >23 women);diabetes mellitus;dyslipidemia;family history of premature cardiovascular disease;hypertension;male gender;obesity (BMI >30kg/m2);smoking/ tobacco exposure     Objective:    Today's Vitals   02/23/23 1046  Weight: 200 lb (90.7 kg)  Height: 5\' 6"  (1.676 m)  PainSc: 0-No pain   Body mass index is 32.28 kg/m.     02/23/2023   10:48 AM 01/26/2022    9:36 AM 01/21/2021    6:09 PM 12/13/2018    1:23 PM 11/29/2017    9:40 AM  Advanced Directives  Does Patient Have a Medical Advance Directive? Yes No No No No  Type of Estate agent of [place];Living will      Copy of Healthcare Power of Attorney in Chart? No - copy requested      Would patient like information on creating a medical advance directive?  No - Patient declined Yes (ED - Information included in AVS) No - Patient declined Yes (ED - Information included in AVS)    Current Medications (verified) Outpatient Encounter Medications as of  02/23/2023  Medication Sig   alfuzosin (UROXATRAL) 10 MG 24 hr tablet Take 1 tablet (10 mg total) by mouth at bedtime.   amLODipine (NORVASC) 10 MG tablet Take 1 tablet (10 mg total) by mouth daily.   aspirin EC 81 MG tablet Take 81 mg by mouth daily.   buPROPion (WELLBUTRIN XL) 150 MG 24 hr tablet Take 1 tablet (150 mg total) by mouth daily.   dapagliflozin propanediol (FARXIGA) 10 MG TABS tablet Take 1 tablet (10 mg total) by mouth daily before breakfast.   doxycycline (VIBRA-TABS) 100 MG tablet Take 1 tablet (100 mg total) by mouth 2 (two) times daily.   doxycycline (VIBRA-TABS) 100 MG tablet Take 1 tablet (100 mg total) by mouth 2 (two) times daily.   finasteride (PROSCAR) 5 MG tablet Take 1 tablet (5 mg total) by mouth daily.   glipiZIDE (GLUCOTROL XL) 10 MG 24 hr tablet Take 1 tablet (10 mg total) by mouth daily with breakfast.   glucose blood (ONETOUCH ULTRA) test strip Use as instructed twice per day E11.9   latanoprost (XALATAN) 0.005 % ophthalmic solution 1 drop at bedtime.   lidocaine-prilocaine (EMLA) cream Apply 1 Application topically as needed. To area 1 hour prior to injection and cover with saran wrap to numb the skin   LORazepam (ATIVAN) 0.5 MG tablet 1-2 tabs 30 - 60 min prior to injection. Do not drive with this medicine.   metFORMIN (GLUCOPHAGE) 1000 MG tablet Take 1 tablet (1,000 mg total) by mouth 2 (two) times daily with a meal.   OneTouch Delica Lancets 33G MISC  Use to check blood sugars 2 times daily   rosuvastatin (CRESTOR) 20 MG tablet Take 1 tablet (20 mg total) by mouth daily.   sildenafil (VIAGRA) 100 MG tablet Take 0.5-1 tablets (50-100 mg total) by mouth daily as needed for erectile dysfunction.   varenicline (CHANTIX PAK) 0.5 MG X 11 & 1 MG X 42 tablet Take one 0.5 mg tablet by mouth once daily for 3 days, then increase to one 0.5 mg tablet twice daily for 4 days, then increase to one 1 mg tablet twice daily.   varenicline (CHANTIX) 1 MG tablet Take 1 tablet (1 mg  total) by mouth 2 (two) times daily.   No facility-administered encounter medications on file as of 02/23/2023.    Allergies (verified) Patient has no known allergies.   History: Past Medical History:  Diagnosis Date   Anxiety 06/20/2020   Aortic atherosclerosis (HCC) 06/20/2020   Asthma    BPH (benign prostatic hyperplasia) 06/20/2020   Depression    Diabetes mellitus without complication (HCC)    Dyslexia 06/20/2020   Essential hypertension 09/23/2016   Hyperlipemia    Hyperlipidemia 09/23/2016   Hypertension    Poor short-term memory 06/20/2020   Traumatic brain injury (HCC) 06/20/2020   Type 2 diabetes mellitus (HCC) 09/23/2016   Past Surgical History:  Procedure Laterality Date   Arm surgery Right    LEG SURGERY     Family History  Problem Relation Age of Onset   Diabetes Mother    Alzheimer's disease Maternal Grandmother    Hypertension Maternal Grandmother    Colon cancer Neg Hx    Colon polyps Neg Hx    Social History   Socioeconomic History   Marital status: Widowed    Spouse name: Not on file   Number of children: 1   Years of education: 9   Highest education level: Not on file  Occupational History   Occupation: Disability    Comment: Mental health  Tobacco Use   Smoking status: Every Day    Current packs/day: 2.00    Average packs/day: 2.0 packs/day for 43.0 years (86.0 ttl pk-yrs)    Types: Cigarettes   Smokeless tobacco: Never  Vaping Use   Vaping status: Never Used  Substance and Sexual Activity   Alcohol use: Yes    Comment: social   Drug use: No   Sexual activity: Not Currently  Other Topics Concern   Not on file  Social History Narrative   Fun/Hobby: Curator and art.    Social Determinants of Health   Financial Resource Strain: High Risk (02/23/2023)   Overall Financial Resource Strain (CARDIA)    Difficulty of Paying Living Expenses: Very hard  Food Insecurity: No Food Insecurity (02/23/2023)   Hunger Vital Sign    Worried About Running Out of  Food in the Last Year: Never true    Ran Out of Food in the Last Year: Never true  Transportation Needs: No Transportation Needs (02/23/2023)   PRAPARE - Administrator, Civil Service (Medical): No    Lack of Transportation (Non-Medical): No  Physical Activity: Insufficiently Active (02/23/2023)   Exercise Vital Sign    Days of Exercise per Week: 2 days    Minutes of Exercise per Session: 30 min  Stress: No Stress Concern Present (02/23/2023)   [employer] of Occupational Health - Occupational Stress Questionnaire    Feeling of Stress : Not at all  Social Connections: Socially Isolated (02/23/2023)   Social Connection and Isolation Panel [  NHANES]    Frequency of Communication with Friends and Family: Once a week    Frequency of Social Gatherings with Friends and Family: Once a week    Attends Religious Services: Never    Database administrator or Organizations: No    Attends Banker Meetings: Never    Marital Status: Widowed    Tobacco Counseling Ready to quit: Not Answered Counseling given: Not Answered   Clinical Intake:  Pre-visit preparation completed: Yes  Pain : No/denies pain Pain Score: 0-No pain     BMI - recorded: 32.38 Nutritional Status: BMI > 30  Obese Nutritional Risks: None Diabetes: Yes CBG done?: Yes (120) CBG resulted in Enter/ Edit results?: Yes Did pt. bring in CBG monitor from home?: No  How often do you need to have someone help you when you read instructions, pamphlets, or other written materials from your doctor or pharmacy?: 5 - Always What is the last grade level you completed in school?: High School  Interpreter Needed?: No  Information entered by :: Banessa Mao N. Donna Snooks, LPN.   Activities of Daily Living    02/23/2023   10:50 AM 02/22/2023    9:31 PM  In your present state of health, do you have any difficulty performing the following activities:  Hearing? 0 0  Vision? 0 0  Difficulty concentrating or  making decisions? 1 1  Walking or climbing stairs? 0   Dressing or bathing? 0 0  Doing errands, shopping? 1   Preparing Food and eating ? N Y  Using the Toilet? N N  In the past six months, have you accidently leaked urine? N   Do you have problems with loss of bowel control? N N  Managing your Medications? Y Y  Managing your Finances? Malvin Johns  Housekeeping or managing your Housekeeping? N N    Patient Care Team: Corwin Levins, MD as PCP - General (Internal Medicine) Sallye Lat, MD as Consulting Physician (Ophthalmology) Helane Gunther, DPM as Consulting Physician (Podiatry)  Indicate any recent Medical Services you may have received from other than [facility] providers in the past year (date may be approximate).     Assessment:   This is a routine wellness examination for Hoa.  Hearing/Vision screen Hearing Screening - Comments:: Patient denied any hearing difficulty.   No hearing aids.  Vision Screening - Comments:: Patient does wear corrective lenses/contacts.  Annual eye exam done by: Sallye Lat, MD.    Goals Addressed   None   Depression Screen    02/23/2023   10:49 AM 01/01/2023   10:57 AM 07/03/2022    9:44 AM 01/26/2022    9:34 AM 12/26/2021   10:10 AM 06/27/2021   10:13 AM 01/21/2021    5:53 PM  PHQ 2/9 Scores  PHQ - 2 Score 2 0 3 0 0 1 1  PHQ- 9 Score 2  5 0 0      Fall Risk    02/23/2023   10:49 AM 02/22/2023    9:31 PM 01/01/2023   10:57 AM 07/03/2022    9:45 AM 01/26/2022    9:24 AM  Fall Risk   Falls in the past year? 0 0 0 0 0  Number falls in past yr: 0 0 0 0 0  Injury with Fall? 0 0 0 0 0  Risk for fall due to : No Fall Risks  No Fall Risks No Fall Risks No Fall Risks  Follow up Falls prevention discussed  Falls evaluation  completed Falls evaluation completed Falls prevention discussed    MEDICARE RISK AT HOME: Medicare Risk at Home Any stairs in or around the home?: Yes If so, are there any without handrails?: No Home free of loose  throw rugs in walkways, pet beds, electrical cords, etc?: Yes Adequate lighting in your home to reduce risk of falls?: Yes Life alert?: No Use of a cane, walker or w/c?: No Grab bars in the bathroom?: Yes Shower chair or bench in shower?: No Elevated toilet seat or a handicapped toilet?: No  TIMED UP AND GO:  Was the test performed?  No    Cognitive Function:    02/23/2023   10:51 AM 12/13/2018    2:47 PM 11/29/2017    9:50 AM  MMSE - Mini Mental State Exam  Not completed: Unable to complete Unable to complete Refused        02/23/2023   10:50 AM 01/26/2022    9:37 AM 01/21/2021    6:06 PM  6CIT Screen  What Year? 0 points 0 points 0 points  What month? 0 points 0 points 3 points  What time? 0 points 0 points 0 points  Count back from 20 0 points 0 points 4 points  Months in reverse 0 points 0 points 4 points  Repeat phrase 0 points 0 points 4 points  Total Score 0 points 0 points 15 points    Immunizations Immunization History  Administered Date(s) Administered   Fluad Quad(high Dose 65+) 06/27/2021, 07/03/2022   Influenza,inj,Quad PF,6+ Mos 05/30/2018, 06/20/2020   PFIZER(Purple Top)SARS-COV-2 Vaccination 09/04/2019, 09/20/2019, 04/22/2020, 12/12/2020   PNEUMOCOCCAL CONJUGATE-20 06/27/2021   Pneumococcal Polysaccharide-23 11/29/2017   Tdap 11/29/2017    TDAP status: Up to date  Flu Vaccine status: Due, Education has been provided regarding the importance of this vaccine. Advised may receive this vaccine at local pharmacy or Health Dept. Aware to provide a copy of the vaccination record if obtained from local pharmacy or Health Dept. Verbalized acceptance and understanding.  Pneumococcal vaccine status: Up to date  Covid-19 vaccine status: Completed vaccines  Qualifies for Shingles Vaccine? Yes   Zostavax completed No   Shingrix Completed?: No.    Education has been provided regarding the importance of this vaccine. Patient has been advised to call insurance  company to determine out of pocket expense if they have not yet received this vaccine. Advised may also receive vaccine at local pharmacy or Health Dept. Verbalized acceptance and understanding.  Screening Tests Health Maintenance  Topic Date Due   Zoster Vaccines- Shingrix (1 of 2) Never done   INFLUENZA VACCINE  12/17/2022   COVID-19 Vaccine (5 - 2023-24 season) 01/17/2023   Lung Cancer Screening  07/04/2023 (Originally 01/27/2020)   Colonoscopy  07/04/2023 (Originally 01/29/2022)   OPHTHALMOLOGY EXAM  05/26/2023   FOOT EXAM  07/04/2023   HEMOGLOBIN A1C  08/01/2023   Diabetic kidney evaluation - eGFR measurement  02/01/2024   Diabetic kidney evaluation - Urine ACR  02/01/2024   Medicare Annual Wellness (AWV)  02/23/2024   DTaP/Tdap/Td (2 - Td or Tdap) 11/30/2027   Pneumonia Vaccine 75+ Years old  Completed   Hepatitis C Screening  Completed   HPV VACCINES  Aged Out    Health Maintenance  Health Maintenance Due  Topic Date Due   Zoster Vaccines- Shingrix (1 of 2) Never done   INFLUENZA VACCINE  12/17/2022   COVID-19 Vaccine (5 - 2023-24 season) 01/17/2023    Colorectal cancer screening: Type of screening: Colonoscopy. Completed 01/30/2019.  Repeat every 3 years. Screening has been postponed by pcp until 07/04/2023.  Lung Cancer Screening: (Low Dose CT Chest recommended if Age 59-80 years, 20 pack-year currently smoking OR have quit w/in 15years.) does qualify.   Lung Cancer Screening Referral: postponed until 07/04/2023 by pcp  Additional Screening:  Hepatitis C Screening: does qualify; Completed 11/29/2017  Vision Screening: Recommended annual ophthalmology exams for early detection of glaucoma and other disorders of the eye. Is the patient up to date with their annual eye exam?  Yes  Who is the provider or what is the name of the office in which the patient attends annual eye exams? Sallye Lat, MD. If pt is not established with a provider, would they like to be referred  to a provider to establish care? No .   Dental Screening: Recommended annual dental exams for proper oral hygiene  Diabetic Foot Exam: Diabetic Foot Exam: Completed 07/03/2022  Community Resource Referral / Chronic Care Management: CRR required this visit?  No   CCM required this visit?  No     Plan:     I have personally reviewed and noted the following in the patient's chart:   Medical and social history Use of alcohol, tobacco or illicit drugs  Current medications and supplements including opioid prescriptions. Patient is not currently taking opioid prescriptions. Functional ability and status Nutritional status Physical activity Advanced directives List of other physicians Hospitalizations, surgeries, and ER visits in previous 12 months Vitals Screenings to include cognitive, depression, and falls Referrals and appointments  In addition, I have reviewed and discussed with patient certain preventive protocols, quality metrics, and best practice recommendations. A written personalized care plan for preventive services as well as general preventive health recommendations were provided to patient.     Mickeal Needy, LPN   03/26/1477   After Visit Summary: (Mail) Due to this being a telephonic visit, the after visit summary with patients personalized plan was offered to patient via mail   Nurse Notes: Normal cognitive status assessed by direct observation via telephone conversation by this Nurse Health Advisor. No abnormalities found.

## 2023-02-23 NOTE — Patient Instructions (Signed)
Mr. Benjamin Serrano , Thank you for taking time to come for your Medicare Wellness Visit. I appreciate your ongoing commitment to your health goals. Please review the following plan we discussed and let me know if I can assist you in the future.   Referrals/Orders/Follow-Ups/Clinician Recommendations: No  This is a list of the screening recommended for you and due dates:  Health Maintenance  Topic Date Due   Zoster (Shingles) Vaccine (1 of 2) Never done   Flu Shot  12/17/2022   COVID-19 Vaccine (5 - 2023-24 season) 01/17/2023   Screening for Lung Cancer  07/04/2023*   Colon Cancer Screening  07/04/2023*   Eye exam for diabetics  05/26/2023   Complete foot exam   07/04/2023   Hemoglobin A1C  08/01/2023   Yearly kidney function blood test for diabetes  02/01/2024   Yearly kidney health urinalysis for diabetes  02/01/2024   Medicare Annual Wellness Visit  02/23/2024   DTaP/Tdap/Td vaccine (2 - Td or Tdap) 11/30/2027   Pneumonia Vaccine  Completed   Hepatitis C Screening  Completed   HPV Vaccine  Aged Out  *Topic was postponed. The date shown is not the original due date.    Advanced directives: (Copy Requested) Please bring a copy of your health care power of attorney and living will to the office to be added to your chart at your convenience.  Next Medicare Annual Wellness Visit scheduled for next year: Yes

## 2023-03-22 ENCOUNTER — Other Ambulatory Visit: Payer: Self-pay

## 2023-03-22 ENCOUNTER — Other Ambulatory Visit: Payer: Self-pay | Admitting: Internal Medicine

## 2023-03-22 DIAGNOSIS — I10 Essential (primary) hypertension: Secondary | ICD-10-CM

## 2023-03-22 DIAGNOSIS — E119 Type 2 diabetes mellitus without complications: Secondary | ICD-10-CM

## 2023-03-26 ENCOUNTER — Other Ambulatory Visit: Payer: Self-pay | Admitting: Internal Medicine

## 2023-03-29 ENCOUNTER — Other Ambulatory Visit: Payer: Self-pay

## 2023-05-28 ENCOUNTER — Other Ambulatory Visit: Payer: Self-pay | Admitting: Internal Medicine

## 2023-05-31 ENCOUNTER — Other Ambulatory Visit: Payer: Self-pay

## 2023-06-03 ENCOUNTER — Other Ambulatory Visit: Payer: Self-pay | Admitting: Internal Medicine

## 2023-06-04 ENCOUNTER — Other Ambulatory Visit: Payer: Self-pay | Admitting: Internal Medicine

## 2023-07-09 ENCOUNTER — Ambulatory Visit: Payer: 59 | Admitting: Internal Medicine

## 2023-08-06 ENCOUNTER — Encounter: Payer: Self-pay | Admitting: Internal Medicine

## 2023-08-06 ENCOUNTER — Other Ambulatory Visit: Payer: Self-pay | Admitting: Internal Medicine

## 2023-08-06 ENCOUNTER — Ambulatory Visit: Payer: 59 | Admitting: Internal Medicine

## 2023-08-06 VITALS — BP 126/70 | HR 68 | Temp 98.4°F | Ht 66.0 in | Wt 200.0 lb

## 2023-08-06 DIAGNOSIS — E119 Type 2 diabetes mellitus without complications: Secondary | ICD-10-CM

## 2023-08-06 DIAGNOSIS — Z125 Encounter for screening for malignant neoplasm of prostate: Secondary | ICD-10-CM | POA: Diagnosis not present

## 2023-08-06 DIAGNOSIS — E782 Mixed hyperlipidemia: Secondary | ICD-10-CM | POA: Diagnosis not present

## 2023-08-06 DIAGNOSIS — I10 Essential (primary) hypertension: Secondary | ICD-10-CM

## 2023-08-06 DIAGNOSIS — E1165 Type 2 diabetes mellitus with hyperglycemia: Secondary | ICD-10-CM | POA: Diagnosis not present

## 2023-08-06 DIAGNOSIS — Z0000 Encounter for general adult medical examination without abnormal findings: Secondary | ICD-10-CM | POA: Diagnosis not present

## 2023-08-06 DIAGNOSIS — Z7984 Long term (current) use of oral hypoglycemic drugs: Secondary | ICD-10-CM

## 2023-08-06 DIAGNOSIS — Z0001 Encounter for general adult medical examination with abnormal findings: Secondary | ICD-10-CM

## 2023-08-06 DIAGNOSIS — F32A Depression, unspecified: Secondary | ICD-10-CM | POA: Diagnosis not present

## 2023-08-06 DIAGNOSIS — E559 Vitamin D deficiency, unspecified: Secondary | ICD-10-CM

## 2023-08-06 DIAGNOSIS — Z720 Tobacco use: Secondary | ICD-10-CM

## 2023-08-06 DIAGNOSIS — E538 Deficiency of other specified B group vitamins: Secondary | ICD-10-CM

## 2023-08-06 LAB — MICROALBUMIN / CREATININE URINE RATIO
Creatinine,U: 171 mg/dL
Microalb Creat Ratio: 8.8 mg/g (ref 0.0–30.0)
Microalb, Ur: 1.5 mg/dL (ref 0.0–1.9)

## 2023-08-06 LAB — CBC WITH DIFFERENTIAL/PLATELET
Basophils Absolute: 0 10*3/uL (ref 0.0–0.1)
Basophils Relative: 0.5 % (ref 0.0–3.0)
Eosinophils Absolute: 0.1 10*3/uL (ref 0.0–0.7)
Eosinophils Relative: 2 % (ref 0.0–5.0)
HCT: 45.5 % (ref 39.0–52.0)
Hemoglobin: 15.2 g/dL (ref 13.0–17.0)
Lymphocytes Relative: 31.7 % (ref 12.0–46.0)
Lymphs Abs: 1.6 10*3/uL (ref 0.7–4.0)
MCHC: 33.4 g/dL (ref 30.0–36.0)
MCV: 91.4 fl (ref 78.0–100.0)
Monocytes Absolute: 0.5 10*3/uL (ref 0.1–1.0)
Monocytes Relative: 8.8 % (ref 3.0–12.0)
Neutro Abs: 3 10*3/uL (ref 1.4–7.7)
Neutrophils Relative %: 57 % (ref 43.0–77.0)
Platelets: 321 10*3/uL (ref 150.0–400.0)
RBC: 4.98 Mil/uL (ref 4.22–5.81)
RDW: 14.1 % (ref 11.5–15.5)
WBC: 5.2 10*3/uL (ref 4.0–10.5)

## 2023-08-06 LAB — HEMOGLOBIN A1C: Hgb A1c MFr Bld: 7.6 % — ABNORMAL HIGH (ref 4.6–6.5)

## 2023-08-06 LAB — URINALYSIS, ROUTINE W REFLEX MICROSCOPIC
Bilirubin Urine: NEGATIVE
Hgb urine dipstick: NEGATIVE
Leukocytes,Ua: NEGATIVE
Nitrite: NEGATIVE
Specific Gravity, Urine: 1.025 (ref 1.000–1.030)
Total Protein, Urine: NEGATIVE
Urine Glucose: >=1000 — AB
Urobilinogen, UA: 0.2 (ref 0.0–1.0)
pH: 5.5 (ref 5.0–8.0)

## 2023-08-06 LAB — HEPATIC FUNCTION PANEL
ALT: 22 U/L (ref 0–53)
AST: 23 U/L (ref 0–37)
Albumin: 4.6 g/dL (ref 3.5–5.2)
Alkaline Phosphatase: 65 U/L (ref 39–117)
Bilirubin, Direct: 0.1 mg/dL (ref 0.0–0.3)
Total Bilirubin: 0.4 mg/dL (ref 0.2–1.2)
Total Protein: 7.2 g/dL (ref 6.0–8.3)

## 2023-08-06 LAB — VITAMIN B12: Vitamin B-12: 809 pg/mL (ref 211–911)

## 2023-08-06 LAB — BASIC METABOLIC PANEL
BUN: 17 mg/dL (ref 6–23)
CO2: 30 meq/L (ref 19–32)
Calcium: 10 mg/dL (ref 8.4–10.5)
Chloride: 102 meq/L (ref 96–112)
Creatinine, Ser: 1.26 mg/dL (ref 0.40–1.50)
GFR: 59.17 mL/min — ABNORMAL LOW (ref >60.00)
Glucose, Bld: 106 mg/dL — ABNORMAL HIGH (ref 70–99)
Potassium: 5 meq/L (ref 3.5–5.1)
Sodium: 142 meq/L (ref 135–145)

## 2023-08-06 LAB — LIPID PANEL
Cholesterol: 155 mg/dL (ref 0–200)
HDL: 45.3 mg/dL (ref >39.00)
LDL Cholesterol: 70 mg/dL (ref 0–99)
NonHDL: 109.96
Total CHOL/HDL Ratio: 3
Triglycerides: 202 mg/dL — ABNORMAL HIGH (ref 0.0–149.0)
VLDL: 40.4 mg/dL — ABNORMAL HIGH (ref 0.0–40.0)

## 2023-08-06 LAB — VITAMIN D 25 HYDROXY (VIT D DEFICIENCY, FRACTURES): VITD: 47.77 ng/mL (ref 30.00–100.00)

## 2023-08-06 LAB — TSH: TSH: 0.59 u[IU]/mL (ref 0.35–5.50)

## 2023-08-06 LAB — PSA: PSA: 3.28 ng/mL (ref 0.10–4.00)

## 2023-08-06 MED ORDER — ONETOUCH ULTRA VI STRP: ORAL_STRIP | 3 refills | Status: DC

## 2023-08-06 MED ORDER — BUPROPION HCL ER (XL) 300 MG PO TB24: 300.0000 mg | ORAL_TABLET | Freq: Every day | ORAL | 3 refills | Status: DC

## 2023-08-06 MED ORDER — GLIPIZIDE ER 10 MG PO TB24: 10.0000 mg | ORAL_TABLET | Freq: Two times a day (BID) | ORAL | 11 refills | Status: DC

## 2023-08-06 MED ORDER — SILDENAFIL CITRATE 100 MG PO TABS: 50.0000 mg | ORAL_TABLET | Freq: Every day | ORAL | 11 refills | Status: DC | PRN

## 2023-08-06 NOTE — Assessment & Plan Note (Signed)
 Age and sex appropriate education and counseling updated with regular exercise and diet Referrals for preventative services - declines colonoscopy, LDCT screening Immunizations addressed - for shingrix at pharmacy,  Smoking counseling  - pt counsled to quit, pt not ready Evidence for depression or other mood disorder  - with mild worsening, for increased Wellbutrin xl 300 every day, declines other referral psychiatry or counseling Most recent labs reviewed. I have personally reviewed and have noted: 1) the patient's medical and social history 2) The patient's current medications and supplements 3) The patient's height, weight, and BMI have been recorded in the chart

## 2023-08-06 NOTE — Patient Instructions (Addendum)
 Ok to increase the Wellbutrin xl to 300 mg per day   Please continue all other medications as before, and refills have been done if requested - viagra and strips  Please have the pharmacy call with any other refills you may need.  Please continue your efforts at being more active, low cholesterol diet, and weight control.  You are otherwise up to date with prevention measures today.  Please keep your appointments with your specialists as you may have planned  Please go to the LAB at the blood drawing area for the tests to be done  You will be contacted by phone if any changes need to be made immediately.  Otherwise, you will receive a letter about your results with an explanation, but please check with MyChart first.  Please make an Appointment to return in 6 months, or sooner if needed

## 2023-08-06 NOTE — Assessment & Plan Note (Signed)
 Lab Results  Component Value Date   HGBA1C 7.6 (H) 08/06/2023   uncontrolled, pt to continue current medical treatment farxiga 10 every day, metformin 1000 bid, and increase glucotrol xl 10 to bid

## 2023-08-06 NOTE — Assessment & Plan Note (Signed)
 BP Readings from Last 3 Encounters:  08/06/23 126/70  01/01/23 122/76  07/03/22 136/74   Stable, pt to continue medical treatment norvasc 10 qd

## 2023-08-06 NOTE — Assessment & Plan Note (Signed)
 Last vitamin D Lab Results  Component Value Date   VD25OH 47.77 08/06/2023   Stable, cont oral replacement

## 2023-08-06 NOTE — Assessment & Plan Note (Signed)
 Pt counsled to quit, pt not ready

## 2023-08-06 NOTE — Assessment & Plan Note (Signed)
 Lab Results  Component Value Date   LDLCALC 70 08/06/2023   uncontrolled, pt to continue current statin crestor 20 mg every day, declines other change

## 2023-08-06 NOTE — Assessment & Plan Note (Signed)
 Worsening mild to mod, for increased wellbutrin xl 300 every day, decliens psychiatry or counseling referral

## 2023-08-06 NOTE — Progress Notes (Signed)
 Patient ID: Benjamin Serrano, male   DOB: 28-Sep-1955, 68 y.o.   MRN: [id number]         Chief Complaint:: wellness exam and depression, low vit d, dm, smoker, hld, htn       HPI:  Benjamin Serrano is a 68 y.o. male here for wellness exam; declines colonoscopy, LDCT screening, but plans to see his eye doctor soon, for shingrix at pharmacy, o/w up to date; still smoking not ready to quit                        Also Pt denies chest pain, increased sob or doe, wheezing, orthopnea, PND, increased LE swelling, palpitations, dizziness or syncope.   Pt denies polydipsia, polyuria, or new focal neuro s/s.    Pt denies fever, wt loss, night sweats, loss of appetite, or other constitutional symptoms  Has had mild to mod worsening depressive symptoms, but no suicidal ideation, or panic; has ongoing anxiety.     Wt Readings from Last 3 Encounters:  08/06/23 200 lb (90.7 kg)  02/23/23 200 lb (90.7 kg)  01/01/23 192 lb (87.1 kg)   BP Readings from Last 3 Encounters:  08/06/23 126/70  01/01/23 122/76  07/03/22 136/74   Immunization History  Administered Date(s) Administered   Fluad Quad(high Dose 65+) 06/27/2021, 07/03/2022, 02/27/2023   Influenza,inj,Quad PF,6+ Mos 05/30/2018, 06/20/2020   PFIZER(Purple Top)SARS-COV-2 Vaccination 09/04/2019, 09/20/2019, 04/22/2020, 12/12/2020   PNEUMOCOCCAL CONJUGATE-20 06/27/2021   Pfizer Covid-19 Vaccine Bivalent Booster 62yrs & up 02/27/2023   Pneumococcal Polysaccharide-23 11/29/2017   Tdap 11/29/2017   Health Maintenance Due  Topic Date Due   Zoster Vaccines- Shingrix (1 of 2) Never done   Lung Cancer Screening  01/27/2020   Colonoscopy  01/29/2022   OPHTHALMOLOGY EXAM  05/26/2023      Past Medical History:  Diagnosis Date   Anxiety 06/20/2020   Aortic atherosclerosis (HCC) 06/20/2020   Asthma    BPH (benign prostatic hyperplasia) 06/20/2020   Depression    Diabetes mellitus without complication (HCC)    Dyslexia 06/20/2020   Essential hypertension  09/23/2016   Hyperlipemia    Hyperlipidemia 09/23/2016   Hypertension    Poor short-term memory 06/20/2020   Traumatic brain injury (HCC) 06/20/2020   Type 2 diabetes mellitus (HCC) 09/23/2016   Past Surgical History:  Procedure Laterality Date   Arm surgery Right    LEG SURGERY      reports that he has been smoking cigarettes. He has a 86 pack-year smoking history. He has never used smokeless tobacco. He reports current alcohol use. He reports that he does not use drugs. family history includes Alzheimer's disease in his maternal grandmother; Diabetes in his mother; Hypertension in his maternal grandmother. No Known Allergies Current Outpatient Medications on File Prior to Visit  Medication Sig Dispense Refill   alfuzosin (UROXATRAL) 10 MG 24 hr tablet TAKE 1 TABLET BY MOUTH EVERY DAY AT BEDTIME 30 tablet 10   amLODipine (NORVASC) 10 MG tablet TAKE 1 TABLET BY MOUTH ONCE DAILY 30 tablet 10   aspirin EC 81 MG tablet Take 81 mg by mouth daily.     FARXIGA 10 MG TABS tablet TAKE 1 TABLET BY MOUTH DAILY BEFORE BREAKFAST 30 tablet 10   finasteride (PROSCAR) 5 MG tablet TAKE 1 TABLET BY MOUTH ONCE DAILY 30 tablet 10   latanoprost (XALATAN) 0.005 % ophthalmic solution 1 drop at bedtime.     lidocaine-prilocaine (EMLA) cream Apply 1 Application topically  as needed. To area 1 hour prior to injection and cover with saran wrap to numb the skin 30 g 12   LORazepam (ATIVAN) 0.5 MG tablet 1-2 tabs 30 - 60 min prior to injection. Do not drive with this medicine. 4 tablet 0   metFORMIN (GLUCOPHAGE) 1000 MG tablet TAKE 1 TABLET BY MOUTH WITH BREAKFAST AND AT BEDTIME WITH A MEAL 60 tablet 10   OneTouch Delica Lancets 33G MISC Use to check blood sugars 2 times daily 100 each 5   rosuvastatin (CRESTOR) 20 MG tablet TAKE 1 TABLET BY MOUTH EVERY DAY AT BEDTIME 30 tablet 10   varenicline (CHANTIX PAK) 0.5 MG X 11 & 1 MG X 42 tablet Take one 0.5 mg tablet by mouth once daily for 3 days, then increase to one 0.5 mg  tablet twice daily for 4 days, then increase to one 1 mg tablet twice daily. 53 tablet 0   varenicline (CHANTIX) 1 MG tablet Take 1 tablet (1 mg total) by mouth 2 (two) times daily. 60 tablet 0   No current facility-administered medications on file prior to visit.        ROS:  All others reviewed and negative.  Objective        PE:  BP 126/70 (BP Location: Right Arm, Patient Position: Sitting, Cuff Size: Normal)   Pulse 68   Temp 98.4 F (36.9 C) (Oral)   Ht 5\' 6"  (1.676 m)   Wt 200 lb (90.7 kg)   SpO2 98%   BMI 32.28 kg/m                 Constitutional: Pt appears in NAD               HENT: Head: NCAT.                Right Ear: External ear normal.                 Left Ear: External ear normal.                Eyes: . Pupils are equal, round, and reactive to light. Conjunctivae and EOM are normal               Nose: without d/c or deformity               Neck: Neck supple. Gross normal ROM               Cardiovascular: Normal rate and regular rhythm.                 Pulmonary/Chest: Effort normal and breath sounds without rales or wheezing.                Abd:  Soft, NT, ND, + BS, no organomegaly               Neurological: Pt is alert. At baseline orientation, motor grossly intact               Skin: Skin is warm. No rashes, no other new lesions, LE edema - none               Psychiatric: Pt behavior is normal without agitation   Micro: none  Cardiac tracings I have personally interpreted today:  none  Pertinent Radiological findings (summarize): none   Lab Results  Component Value Date   WBC 5.2 08/06/2023   HGB 15.2 08/06/2023   HCT 45.5 08/06/2023   PLT  321.0 08/06/2023   GLUCOSE 106 (H) 08/06/2023   CHOL 155 08/06/2023   TRIG 202.0 (H) 08/06/2023   HDL 45.30 08/06/2023   LDLCALC 70 08/06/2023   ALT 22 08/06/2023   AST 23 08/06/2023   NA 142 08/06/2023   K 5.0 08/06/2023   CL 102 08/06/2023   CREATININE 1.26 08/06/2023   BUN 17 08/06/2023   CO2 30  08/06/2023   TSH 0.59 08/06/2023   PSA 3.28 08/06/2023   HGBA1C 7.6 (H) 08/06/2023   MICROALBUR 1.5 08/06/2023   Assessment/Plan:  Benjamin Serrano is a 68 y.o. Black or African American [2] male with  has a past medical history of Anxiety (06/20/2020), Aortic atherosclerosis (HCC) (06/20/2020), Asthma, BPH (benign prostatic hyperplasia) (06/20/2020), Depression, Diabetes mellitus without complication (HCC), Dyslexia (06/20/2020), Essential hypertension (09/23/2016), Hyperlipemia, Hyperlipidemia (09/23/2016), Hypertension, Poor short-term memory (06/20/2020), Traumatic brain injury (HCC) (06/20/2020), and Type 2 diabetes mellitus (HCC) (09/23/2016).  Encounter for well adult exam with abnormal findings Age and sex appropriate education and counseling updated with regular exercise and diet Referrals for preventative services - declines colonoscopy, LDCT screening Immunizations addressed - for shingrix at pharmacy,  Smoking counseling  - pt counsled to quit, pt not ready Evidence for depression or other mood disorder  - with mild worsening, for increased Wellbutrin xl 300 every day, declines other referral psychiatry or counseling Most recent labs reviewed. I have personally reviewed and have noted: 1) the patient's medical and social history 2) The patient's current medications and supplements 3) The patient's height, weight, and BMI have been recorded in the chart   Vitamin D deficiency Last vitamin D Lab Results  Component Value Date   VD25OH 47.77 08/06/2023   Stable, cont oral replacement   Type 2 diabetes mellitus (HCC) Lab Results  Component Value Date   HGBA1C 7.6 (H) 08/06/2023   uncontrolled, pt to continue current medical treatment farxiga 10 every day, metformin 1000 bid, and increase glucotrol xl 10 to bid   Tobacco abuse Pt counsled to quit, pt not ready  Hyperlipidemia Lab Results  Component Value Date   LDLCALC 70 08/06/2023   uncontrolled, pt to continue current statin  crestor 20 mg every day, declines other change   Essential hypertension BP Readings from Last 3 Encounters:  08/06/23 126/70  01/01/23 122/76  07/03/22 136/74   Stable, pt to continue medical treatment norvasc 10 qd   Depression Worsening mild to mod, for increased wellbutrin xl 300 every day, decliens psychiatry or counseling referral   Followup: Return in about 6 months (around 02/06/2024).  Oliver Barre, MD 08/06/2023 8:01 PM [facility] - [facility]

## 2023-10-12 DIAGNOSIS — H40013 Open angle with borderline findings, low risk, bilateral: Secondary | ICD-10-CM | POA: Diagnosis not present

## 2023-11-26 ENCOUNTER — Other Ambulatory Visit: Payer: Self-pay | Admitting: Internal Medicine

## 2023-11-26 MED ORDER — ACCU-CHEK GUIDE ME W/DEVICE KIT: PACK | 0 refills | Status: DC

## 2023-11-26 MED ORDER — ACCU-CHEK GUIDE TEST VI STRP: ORAL_STRIP | 12 refills | Status: DC

## 2023-11-26 NOTE — Telephone Encounter (Signed)
 Ok to let pt know - we sent rx for new accucheck Guide Me meter and strips as the previous is no longer covered

## 2023-11-29 ENCOUNTER — Other Ambulatory Visit: Payer: Self-pay | Admitting: Internal Medicine

## 2023-11-29 ENCOUNTER — Telehealth: Payer: Self-pay | Admitting: Internal Medicine

## 2023-11-29 NOTE — Telephone Encounter (Unsigned)
 Copied from CRM [id number]. Topic: Clinical - Medication Refill >> Nov 29, 2023 12:46 PM Suzen RAMAN wrote: Medication:Accu-Chek or Contour next products.  (Patient has been notified by his insurance that they no longer cover one touch products but would cover products listed above) CB#[phone]-with additional questions   Has the patient contacted their pharmacy? Yes  This is the patient's preferred pharmacy:   [facility], [place] - [address] [place] [address] Phone: [phone] Fax: [phone]  Is this the correct pharmacy for this prescription? Yes If no, delete pharmacy and type the correct one.   Has the prescription been filled recently? No  Is the patient out of the medication? No  Has the patient been seen for an appointment in the last year OR does the patient have an upcoming appointment? Yes  Can we respond through MyChart? Yes  Agent: Please be advised that Rx refills may take up to 3 business days. We ask that you follow-up with your pharmacy.

## 2023-12-03 ENCOUNTER — Other Ambulatory Visit: Payer: Self-pay

## 2023-12-03 MED ORDER — ACCU-CHEK SOFTCLIX LANCETS MISC: 12 refills | Status: DC

## 2023-12-03 NOTE — Telephone Encounter (Signed)
 Looks as though all Accu-check products were put in outside of the lancets on 07/11. Have since placed prescription for lancets

## 2023-12-07 ENCOUNTER — Telehealth: Payer: Self-pay

## 2023-12-07 NOTE — Telephone Encounter (Signed)
 Copied from CRM [id number]. Topic: General - Phone/Fax/Address >> Dec 07, 2023  2:27 PM Precious C wrote:  Maurilio from [facility] called in to confirm clinic's fax information. Also, to verify if the clinic received a fax she sent over for a patient. She requested that, if possible, the form be signed and faxed back to her. She primarily wanted to confirm receipt and kindly asked for the signed paperwork to be returned.

## 2023-12-08 NOTE — Telephone Encounter (Signed)
 Called and informed representative at [facility] that forms had been received. Sending those back to PCP now to be signed, will fax once received

## 2023-12-09 NOTE — Telephone Encounter (Unsigned)
 Copied from CRM [id number]. Topic: General - Other >> Dec 09, 2023 11:55 AM Burnard DEL wrote: Reason for CRM: [facility]  called in to check on status of form being faxed back to them.I relayed message from provider and they would just like for that statement to be put on the form and faxed back to them.  Fx#:[phone]

## 2023-12-09 NOTE — Telephone Encounter (Signed)
 I reviewed the form for stool testing,  I decline to sign as pt does not have GI symptoms, and this testing is too general and possibly a SCAM or represents a kind of elder abuse

## 2023-12-10 NOTE — Telephone Encounter (Signed)
 This has been faxed back with confirmation

## 2023-12-27 ENCOUNTER — Telehealth: Payer: Self-pay

## 2023-12-27 NOTE — Telephone Encounter (Signed)
 Copied from CRM #[id number]. Topic: Clinical - Prescription Issue >> Dec 27, 2023  1:54 PM Mesmerise C wrote: Reason for CRM: Patient's sister Leeroy stated pharmacy didn't receive prescription for the device to check blood levels but received the test strips needs to be sent over again to [facility]

## 2023-12-27 NOTE — Telephone Encounter (Signed)
 Copied from CRM #[id number]. Topic: Clinical - Prescription Issue >> Dec 27, 2023  1:54 PM Mesmerise C wrote: Reason for CRM: Patient's sister Benjamin Serrano stated pharmacy didn't receive prescription for the device to check blood levels but received the test strips needs to be sent over again to [facility]

## 2023-12-29 ENCOUNTER — Other Ambulatory Visit: Payer: Self-pay

## 2023-12-29 DIAGNOSIS — E1165 Type 2 diabetes mellitus with hyperglycemia: Secondary | ICD-10-CM

## 2023-12-29 MED ORDER — ACCU-CHEK GUIDE ME W/DEVICE KIT: PACK | 0 refills | Status: DC

## 2023-12-29 NOTE — Telephone Encounter (Signed)
 Have since placed a new prescription into [facility] for the blood glucose kit

## 2023-12-30 NOTE — Telephone Encounter (Signed)
 Called and informed patient's sister Leeroy that new prescription had been placed and should be on the way

## 2024-01-20 ENCOUNTER — Telehealth: Payer: Self-pay

## 2024-01-20 NOTE — Telephone Encounter (Signed)
 Copied from CRM [id number]. Topic: Clinical - Prescription Issue >> Jan 20, 2024  1:22 PM Chasity T wrote: Reason for CRM: Patient sister Leeroy has stated that he still hasn't received his glucose strips and monitior from the pharmacy. Please contact her regarding manner [phone]

## 2024-01-24 ENCOUNTER — Other Ambulatory Visit: Payer: Self-pay | Admitting: Internal Medicine

## 2024-01-24 DIAGNOSIS — I10 Essential (primary) hypertension: Secondary | ICD-10-CM

## 2024-01-24 DIAGNOSIS — E119 Type 2 diabetes mellitus without complications: Secondary | ICD-10-CM

## 2024-01-25 ENCOUNTER — Telehealth: Payer: Self-pay

## 2024-01-25 NOTE — Telephone Encounter (Signed)
 Copied from CRM [id number]. Topic: Clinical - Prescription Issue >> Jan 25, 2024  1:14 PM Macario HERO wrote: Reason for CRM: Patient sister called regarding patient's needing a new prescription for glucose strips because the insurance will not cover glucose blood (ACCU-CHEK GUIDE TEST) test strip [[id number]]. Sister is requesting a call back.

## 2024-02-08 ENCOUNTER — Ambulatory Visit: Admitting: Internal Medicine

## 2024-02-08 ENCOUNTER — Encounter: Payer: Self-pay | Admitting: Internal Medicine

## 2024-02-08 VITALS — BP 146/72 | HR 72 | Temp 97.9°F | Ht 66.0 in | Wt 205.8 lb

## 2024-02-08 DIAGNOSIS — E559 Vitamin D deficiency, unspecified: Secondary | ICD-10-CM

## 2024-02-08 DIAGNOSIS — E1165 Type 2 diabetes mellitus with hyperglycemia: Secondary | ICD-10-CM | POA: Diagnosis not present

## 2024-02-08 DIAGNOSIS — Z125 Encounter for screening for malignant neoplasm of prostate: Secondary | ICD-10-CM

## 2024-02-08 DIAGNOSIS — R972 Elevated prostate specific antigen [PSA]: Secondary | ICD-10-CM | POA: Diagnosis not present

## 2024-02-08 DIAGNOSIS — N1831 Chronic kidney disease, stage 3a: Secondary | ICD-10-CM

## 2024-02-08 DIAGNOSIS — Z720 Tobacco use: Secondary | ICD-10-CM | POA: Diagnosis not present

## 2024-02-08 DIAGNOSIS — E782 Mixed hyperlipidemia: Secondary | ICD-10-CM | POA: Diagnosis not present

## 2024-02-08 DIAGNOSIS — E538 Deficiency of other specified B group vitamins: Secondary | ICD-10-CM

## 2024-02-08 DIAGNOSIS — I10 Essential (primary) hypertension: Secondary | ICD-10-CM | POA: Diagnosis not present

## 2024-02-08 LAB — CBC WITH DIFFERENTIAL/PLATELET
Basophils Absolute: 0 K/uL (ref 0.0–0.1)
Basophils Relative: 0.4 % (ref 0.0–3.0)
Eosinophils Absolute: 0.1 K/uL (ref 0.0–0.7)
Eosinophils Relative: 2.3 % (ref 0.0–5.0)
HCT: 43.2 % (ref 39.0–52.0)
Hemoglobin: 14.5 g/dL (ref 13.0–17.0)
Lymphocytes Relative: 26 % (ref 12.0–46.0)
Lymphs Abs: 1.2 K/uL (ref 0.7–4.0)
MCHC: 33.5 g/dL (ref 30.0–36.0)
MCV: 91 fl (ref 78.0–100.0)
Monocytes Absolute: 0.4 K/uL (ref 0.1–1.0)
Monocytes Relative: 9.7 % (ref 3.0–12.0)
Neutro Abs: 2.8 K/uL (ref 1.4–7.7)
Neutrophils Relative %: 61.6 % (ref 43.0–77.0)
Platelets: 243 K/uL (ref 150.0–400.0)
RBC: 4.75 Mil/uL (ref 4.22–5.81)
RDW: 14.1 % (ref 11.5–15.5)
WBC: 4.5 K/uL (ref 4.0–10.5)

## 2024-02-08 LAB — HEPATIC FUNCTION PANEL
ALT: 32 U/L (ref 0–53)
AST: 19 U/L (ref 0–37)
Albumin: 4.3 g/dL (ref 3.5–5.2)
Alkaline Phosphatase: 58 U/L (ref 39–117)
Bilirubin, Direct: 0.1 mg/dL (ref 0.0–0.3)
Total Bilirubin: 0.3 mg/dL (ref 0.2–1.2)
Total Protein: 6.7 g/dL (ref 6.0–8.3)

## 2024-02-08 LAB — URINALYSIS, ROUTINE W REFLEX MICROSCOPIC
Bilirubin Urine: NEGATIVE
Hgb urine dipstick: NEGATIVE
Ketones, ur: NEGATIVE
Leukocytes,Ua: NEGATIVE
Nitrite: NEGATIVE
RBC / HPF: NONE SEEN (ref 0–?)
Specific Gravity, Urine: 1.01 (ref 1.000–1.030)
Total Protein, Urine: NEGATIVE
Urine Glucose: >=1000 — AB
Urobilinogen, UA: 0.2 (ref 0.0–1.0)
WBC, UA: NONE SEEN (ref 0–?)
pH: 7 (ref 5.0–8.0)

## 2024-02-08 LAB — MICROALBUMIN / CREATININE URINE RATIO
Creatinine,U: 65.6 mg/dL
Microalb Creat Ratio: UNDETERMINED mg/g (ref 0.0–30.0)
Microalb, Ur: <0.7 mg/dL

## 2024-02-08 LAB — LIPID PANEL
Cholesterol: 154 mg/dL (ref 0–200)
HDL: 52.2 mg/dL (ref >39.00)
LDL Cholesterol: 66 mg/dL (ref 0–99)
NonHDL: 101.48
Total CHOL/HDL Ratio: 3
Triglycerides: 178 mg/dL — ABNORMAL HIGH (ref 0.0–149.0)
VLDL: 35.6 mg/dL (ref 0.0–40.0)

## 2024-02-08 LAB — BASIC METABOLIC PANEL WITH GFR
BUN: 18 mg/dL (ref 6–23)
CO2: 28 meq/L (ref 19–32)
Calcium: 9.5 mg/dL (ref 8.4–10.5)
Chloride: 103 meq/L (ref 96–112)
Creatinine, Ser: 1.34 mg/dL (ref 0.40–1.50)
GFR: 54.76 mL/min — ABNORMAL LOW (ref >60.00)
Glucose, Bld: 181 mg/dL — ABNORMAL HIGH (ref 70–99)
Potassium: 4.6 meq/L (ref 3.5–5.1)
Sodium: 141 meq/L (ref 135–145)

## 2024-02-08 LAB — TSH: TSH: 0.64 u[IU]/mL (ref 0.35–5.50)

## 2024-02-08 LAB — PSA: PSA: 4.32 ng/mL — ABNORMAL HIGH (ref 0.10–4.00)

## 2024-02-08 LAB — VITAMIN D 25 HYDROXY (VIT D DEFICIENCY, FRACTURES): VITD: 40.31 ng/mL (ref 30.00–100.00)

## 2024-02-08 LAB — HEMOGLOBIN A1C: Hgb A1c MFr Bld: 7.5 % — ABNORMAL HIGH (ref 4.6–6.5)

## 2024-02-08 LAB — VITAMIN B12: Vitamin B-12: 690 pg/mL (ref 211–911)

## 2024-02-08 MED ORDER — FREESTYLE LIBRE 3 PLUS SENSOR MISC: 3 refills | Status: DC

## 2024-02-08 MED ORDER — VARENICLINE TARTRATE 1 MG PO TABS: 1.0000 mg | ORAL_TABLET | Freq: Two times a day (BID) | ORAL | 0 refills | Status: DC

## 2024-02-08 MED ORDER — FREESTYLE LIBRE 3 READER DEVI: 0 refills | Status: AC

## 2024-02-08 NOTE — Progress Notes (Signed)
 Patient ID: Benjamin Serrano, male   DOB: 1955/06/21, 68 y.o.   MRN: [id number]        Chief Complaint: follow up HTN, HLD and hyperglycemia, smoker, ckd3a, elevated psa       HPI:  Benjamin Serrano is a 68 y.o. male here overall doing ok.  Pt denies chest pain, increased sob or doe, wheezing, orthopnea, PND, increased LE swelling, palpitations, dizziness or syncope.   Pt denies polydipsia, polyuria, or new focal neuro s/s.    Pt denies fever, wt loss, night sweats, loss of appetite, or other constitutional symptoms  Still smoking, not ready to quit.  Gained wt again from 192 in aug 2025, plans to work on better diet.  BP has been controlled at home.  Wife asks for [place] 3 system start, and will consider mounjaro if elevated A1c.  Denies urinary symptoms such as dysuria, frequency, urgency, flank pain, hematuria or n/v, fever, chills.   Wt Readings from Last 3 Encounters:  02/08/24 205 lb 12.8 oz (93.4 kg)  08/06/23 200 lb (90.7 kg)  02/23/23 200 lb (90.7 kg)   BP Readings from Last 3 Encounters:  02/08/24 (!) 146/72  08/06/23 126/70  01/01/23 122/76         Past Medical History:  Diagnosis Date   Anxiety 06/20/2020   Aortic atherosclerosis 06/20/2020   Asthma    BPH (benign prostatic hyperplasia) 06/20/2020   Depression    Diabetes mellitus without complication (HCC)    Dyslexia 06/20/2020   Essential hypertension 09/23/2016   Hyperlipemia    Hyperlipidemia 09/23/2016   Hypertension    Poor short-term memory 06/20/2020   Traumatic brain injury (HCC) 06/20/2020   Type 2 diabetes mellitus (HCC) 09/23/2016   Past Surgical History:  Procedure Laterality Date   Arm surgery Right    LEG SURGERY      reports that he has been smoking cigarettes. He has a 86 pack-year smoking history. He has never used smokeless tobacco. He reports current alcohol use. He reports that he does not use drugs. family history includes Alzheimer's disease in his maternal grandmother; Diabetes in his mother; Hypertension in  his maternal grandmother. No Known Allergies Current Outpatient Medications on File Prior to Visit  Medication Sig Dispense Refill   Accu-Chek Softclix Lancets lancets Use as instructed 100 each 12   alfuzosin  (UROXATRAL ) 10 MG 24 hr tablet TAKE 1 TABLET BY MOUTH EVERY DAY AT BEDTIME 30 tablet 10   amLODipine  (NORVASC ) 10 MG tablet TAKE 1 TABLET BY MOUTH ONCE DAILY 30 tablet 11   aspirin EC 81 MG tablet Take 81 mg by mouth daily.     Blood Glucose Monitoring Suppl (ACCU-CHEK GUIDE ME) w/Device KIT Use as directed twice per day E11.9 1 kit 0   buPROPion  (WELLBUTRIN  XL) 300 MG 24 hr tablet Take 1 tablet (300 mg total) by mouth daily. 90 tablet 3   FARXIGA  10 MG TABS tablet TAKE 1 TABLET BY MOUTH DAILY BEFORE BREAKFAST 30 tablet 10   finasteride  (PROSCAR ) 5 MG tablet TAKE 1 TABLET BY MOUTH ONCE DAILY 30 tablet 10   glipiZIDE  (GLUCOTROL  XL) 10 MG 24 hr tablet TAKE 1 TABLET BY MOUTH ONCE DAILY WITH BREAKFAST 30 tablet 11   glucose blood (ACCU-CHEK GUIDE TEST) test strip Use as instructed twice per day E11.9 200 each 12   latanoprost (XALATAN) 0.005 % ophthalmic solution 1 drop at bedtime.     lidocaine -prilocaine  (EMLA ) cream Apply 1 Application topically as needed. To area 1  hour prior to injection and cover with saran wrap to numb the skin 30 g 12   LORazepam  (ATIVAN ) 0.5 MG tablet 1-2 tabs 30 - 60 min prior to injection. Do not drive with this medicine. 4 tablet 0   metFORMIN  (GLUCOPHAGE ) 1000 MG tablet TAKE 1 TABLET BY MOUTH WITH BREAKFAST AND AT BEDTIME WITH A MEAL 60 tablet 11   OneTouch Delica Lancets 33G MISC Use to check blood sugars 2 times daily 100 each 5   rosuvastatin  (CRESTOR ) 20 MG tablet TAKE 1 TABLET BY MOUTH EVERY DAY AT BEDTIME 30 tablet 11   sildenafil  (VIAGRA ) 100 MG tablet Take 0.5-1 tablets (50-100 mg total) by mouth daily as needed for erectile dysfunction. 30 tablet 11   varenicline  (CHANTIX  PAK) 0.5 MG X 11 & 1 MG X 42 tablet Take one 0.5 mg tablet by mouth once daily for 3  days, then increase to one 0.5 mg tablet twice daily for 4 days, then increase to one 1 mg tablet twice daily. 53 tablet 0   No current facility-administered medications on file prior to visit.        ROS:  All others reviewed and negative.  Objective        PE:  BP (!) 146/72   Pulse 72   Temp 97.9 F (36.6 C)   Ht 5' 6 (1.676 m)   Wt 205 lb 12.8 oz (93.4 kg)   SpO2 98%   BMI 33.22 kg/m                 Constitutional: Pt appears in NAD               HENT: Head: NCAT.                Right Ear: External ear normal.                 Left Ear: External ear normal.                Eyes: . Pupils are equal, round, and reactive to light. Conjunctivae and EOM are normal               Nose: without d/c or deformity               Neck: Neck supple. Gross normal ROM               Cardiovascular: Normal rate and regular rhythm.                 Pulmonary/Chest: Effort normal and breath sounds without rales or wheezing.                Abd:  Soft, NT, ND, + BS, no organomegaly               Neurological: Pt is alert. At baseline orientation, motor grossly intact               Skin: Skin is warm. No rashes, no other new lesions, LE edema - none               Psychiatric: Pt behavior is normal without agitation   Micro: none  Cardiac tracings I have personally interpreted today:  none  Pertinent Radiological findings (summarize): none   Lab Results  Component Value Date   WBC 4.5 02/08/2024   HGB 14.5 02/08/2024   HCT 43.2 02/08/2024   PLT 243.0 02/08/2024   GLUCOSE 181 (H) 02/08/2024   CHOL  154 02/08/2024   TRIG 178.0 (H) 02/08/2024   HDL 52.20 02/08/2024   LDLCALC 66 02/08/2024   ALT 32 02/08/2024   AST 19 02/08/2024   NA 141 02/08/2024   K 4.6 02/08/2024   CL 103 02/08/2024   CREATININE 1.34 02/08/2024   BUN 18 02/08/2024   CO2 28 02/08/2024   TSH 0.64 02/08/2024   PSA 4.32 (H) 02/08/2024   HGBA1C 7.5 (H) 02/08/2024   MICROALBUR <0.7 02/08/2024   Assessment/Plan:   Benjamin Serrano is a 68 y.o. Black or African American [2] male with  has a past medical history of Anxiety (06/20/2020), Aortic atherosclerosis (06/20/2020), Asthma, BPH (benign prostatic hyperplasia) (06/20/2020), Depression, Diabetes mellitus without complication (HCC), Dyslexia (06/20/2020), Essential hypertension (09/23/2016), Hyperlipemia, Hyperlipidemia (09/23/2016), Hypertension, Poor short-term memory (06/20/2020), Traumatic brain injury (HCC) (06/20/2020), and Type 2 diabetes mellitus (HCC) (09/23/2016).  Vitamin D  deficiency Last vitamin D  Lab Results  Component Value Date   VD25OH 40.31 02/08/2024   Stable, cont oral replacement   Type 2 diabetes mellitus (HCC) Lab Results  Component Value Date   HGBA1C 7.5 (H) 02/08/2024   uncontrolled, pt to continue current medical treatment farxiga  10 every day, glipizide  xl 10 every day, metformin  1000 bid and will consider start mounjaro but not ready for now; to start [place] 3 monitoring   Tobacco abuse Pt counsled to quit, pt asks for chantix  restart  Hyperlipidemia Lab Results  Component Value Date   LDLCALC 66 02/08/2024   Stable, pt to continue current statin crestor  20 mg qd   Essential hypertension BP Readings from Last 3 Encounters:  02/08/24 (!) 146/72  08/06/23 126/70  01/01/23 122/76   Uncontrolled here, but controlled at home, pt to continue medical treatment norvasc  10 qd   PSA elevation Asympt, for f/u psa with labs  CKD stage 3a, GFR 45-59 ml/min (HCC) Lab Results  Component Value Date   CREATININE 1.34 02/08/2024   Stable overall, cont to avoid nephrotoxins  Followup: Return in about 6 months (around 08/07/2024).  Lynwood Rush, MD 02/08/2024 9:25 PM [facility] [place] [facility] - [facility]

## 2024-02-08 NOTE — Assessment & Plan Note (Signed)
Asympt, for f/u psa with labs

## 2024-02-08 NOTE — Assessment & Plan Note (Signed)
 Lab Results  Component Value Date   CREATININE 1.34 02/08/2024   Stable overall, cont to avoid nephrotoxins

## 2024-02-08 NOTE — Assessment & Plan Note (Signed)
 Last vitamin D  Lab Results  Component Value Date   VD25OH 40.31 02/08/2024   Stable, cont oral replacement

## 2024-02-08 NOTE — Assessment & Plan Note (Signed)
 Lab Results  Component Value Date   LDLCALC 66 02/08/2024   Stable, pt to continue current statin crestor  20 mg qd

## 2024-02-08 NOTE — Assessment & Plan Note (Signed)
 BP Readings from Last 3 Encounters:  02/08/24 (!) 146/72  08/06/23 126/70  01/01/23 122/76   Uncontrolled here, but controlled at home, pt to continue medical treatment norvasc  10 qd

## 2024-02-08 NOTE — Patient Instructions (Signed)
 Ok for the [place] 3 system  Please continue all other medications as before, and refills have been done if requested for the Chantix   Please have the pharmacy call with any other refills you may need.  Please continue your efforts at being more active, low cholesterol diet, and weight control.  Please keep your appointments with your specialists as you may have planned  Please go to the LAB at the blood drawing area for the tests to be done  You will be contacted by phone if any changes need to be made immediately.  Otherwise, you will receive a letter about your results with an explanation, but please check with MyChart first.  Please make an Appointment to return in 6 months, or sooner if needed

## 2024-02-08 NOTE — Assessment & Plan Note (Signed)
 Pt counsled to quit, pt asks for chantix  restart

## 2024-02-08 NOTE — Assessment & Plan Note (Signed)
 Lab Results  Component Value Date   HGBA1C 7.5 (H) 02/08/2024   uncontrolled, pt to continue current medical treatment farxiga  10 every day, glipizide  xl 10 every day, metformin  1000 bid and will consider start mounjaro but not ready for now; to start [place] 3 monitoring

## 2024-02-09 ENCOUNTER — Ambulatory Visit: Payer: Self-pay | Admitting: Internal Medicine

## 2024-02-09 NOTE — Progress Notes (Signed)
 The test results show that your current treatment is OK, as the tests are stable.  Please continue the same plan.  There is no other need for change of treatment or further evaluation based on these results, at this time.  thanks

## 2024-02-22 ENCOUNTER — Other Ambulatory Visit: Payer: Self-pay | Admitting: Internal Medicine

## 2024-02-23 ENCOUNTER — Other Ambulatory Visit: Payer: Self-pay

## 2024-02-24 ENCOUNTER — Ambulatory Visit: Payer: 59

## 2024-02-24 ENCOUNTER — Other Ambulatory Visit: Payer: Self-pay | Admitting: Internal Medicine

## 2024-02-24 DIAGNOSIS — E1165 Type 2 diabetes mellitus with hyperglycemia: Secondary | ICD-10-CM

## 2024-02-25 ENCOUNTER — Other Ambulatory Visit: Payer: Self-pay

## 2024-03-03 ENCOUNTER — Ambulatory Visit (INDEPENDENT_AMBULATORY_CARE_PROVIDER_SITE_OTHER)

## 2024-03-03 VITALS — Ht 66.0 in | Wt 205.0 lb

## 2024-03-03 DIAGNOSIS — Z0000 Encounter for general adult medical examination without abnormal findings: Secondary | ICD-10-CM

## 2024-03-03 DIAGNOSIS — F17200 Nicotine dependence, unspecified, uncomplicated: Secondary | ICD-10-CM

## 2024-03-03 DIAGNOSIS — Z122 Encounter for screening for malignant neoplasm of respiratory organs: Secondary | ICD-10-CM | POA: Diagnosis not present

## 2024-03-03 NOTE — Progress Notes (Signed)
 Subjective:   Benjamin Serrano is a 68 y.o. who presents for a Medicare Wellness preventive visit.  As a reminder, Annual Wellness Visits don't include a physical exam, and some assessments may be limited, especially if this visit is performed virtually. We may recommend an in-person follow-up visit with your provider if needed.  Visit Complete: Virtual I connected with  REFOEL PALLADINO on 03/03/24 by a audio enabled telemedicine application and verified that I am speaking with the correct person using two identifiers.  Patient Location: Home  Provider Location: Office/Clinic  I discussed the limitations of evaluation and management by telemedicine. The patient expressed understanding and agreed to proceed.  Vital Signs: Because this visit was a virtual/telehealth visit, some criteria may be missing or patient reported. Any vitals not documented were not able to be obtained and vitals that have been documented are patient reported.  VideoDeclined- This patient declined Librarian, academic. Therefore the visit was completed with audio only.  Persons Participating in Visit: Patient.  AWV Questionnaire: No: Patient Medicare AWV questionnaire was not completed prior to this visit.  Cardiac Risk Factors include: advanced age (>45men, >26 women);diabetes mellitus;dyslipidemia;hypertension;male gender;smoking/ tobacco exposure;obesity (BMI >30kg/m2)     Objective:    Today's Vitals   03/03/24 0939  Weight: 205 lb (93 kg)  Height: 5' 6 (1.676 m)   Body mass index is 33.09 kg/m.     03/03/2024    9:39 AM 02/23/2023   10:48 AM 01/26/2022    9:36 AM 01/21/2021    6:09 PM 12/13/2018    1:23 PM 11/29/2017    9:40 AM  Advanced Directives  Does Patient Have a Medical Advance Directive? No Yes No No No No   Type of Special educational needs teacher of [place];Living will      Copy of Healthcare Power of Attorney in Chart?  No - copy requested      Would  patient like information on creating a medical advance directive? No - Patient declined  No - Patient declined Yes (ED - Information included in AVS) No - Patient declined  Yes (ED - Information included in AVS)      Data saved with a previous flowsheet row definition    Current Medications (verified) Outpatient Encounter Medications as of 03/03/2024  Medication Sig   Accu-Chek Softclix Lancets lancets Use as instructed   alfuzosin  (UROXATRAL ) 10 MG 24 hr tablet TAKE 1 TABLET BY MOUTH EVERY DAY AT BEDTIME   amLODipine  (NORVASC ) 10 MG tablet TAKE 1 TABLET BY MOUTH ONCE DAILY   aspirin EC 81 MG tablet Take 81 mg by mouth daily.   Blood Glucose Monitoring Suppl (ACCU-CHEK GUIDE ME) w/Device KIT USE AS DIRECTED TWICE A DAY   buPROPion  (WELLBUTRIN  XL) 300 MG 24 hr tablet Take 1 tablet (300 mg total) by mouth daily.   Continuous Glucose Receiver (FREESTYLE LIBRE 3 READER) DEVI Use as directed twice per day  E11.9   Continuous Glucose Sensor (FREESTYLE LIBRE 3 PLUS SENSOR) MISC Change sensor every 15 days.   FARXIGA  10 MG TABS tablet TAKE 1 TABLET BY MOUTH DAILY BEFORE BREAKFAST   finasteride  (PROSCAR ) 5 MG tablet TAKE 1 TABLET BY MOUTH ONCE DAILY   glipiZIDE  (GLUCOTROL  XL) 10 MG 24 hr tablet TAKE 1 TABLET BY MOUTH ONCE DAILY WITH BREAKFAST   glucose blood (ACCU-CHEK GUIDE TEST) test strip Use as instructed twice per day E11.9   latanoprost (XALATAN) 0.005 % ophthalmic solution 1 drop at bedtime.  lidocaine -prilocaine  (EMLA ) cream Apply 1 Application topically as needed. To area 1 hour prior to injection and cover with saran wrap to numb the skin   LORazepam  (ATIVAN ) 0.5 MG tablet 1-2 tabs 30 - 60 min prior to injection. Do not drive with this medicine.   metFORMIN  (GLUCOPHAGE ) 1000 MG tablet TAKE 1 TABLET BY MOUTH WITH BREAKFAST AND AT BEDTIME WITH A MEAL   OneTouch Delica Lancets 33G MISC Use to check blood sugars 2 times daily   rosuvastatin  (CRESTOR ) 20 MG tablet TAKE 1 TABLET BY MOUTH EVERY  DAY AT BEDTIME   sildenafil  (VIAGRA ) 100 MG tablet Take 0.5-1 tablets (50-100 mg total) by mouth daily as needed for erectile dysfunction.   varenicline  (CHANTIX  PAK) 0.5 MG X 11 & 1 MG X 42 tablet Take one 0.5 mg tablet by mouth once daily for 3 days, then increase to one 0.5 mg tablet twice daily for 4 days, then increase to one 1 mg tablet twice daily.   varenicline  (CHANTIX ) 1 MG tablet Take 1 tablet (1 mg total) by mouth 2 (two) times daily.   No facility-administered encounter medications on file as of 03/03/2024.    Allergies (verified) Patient has no known allergies.   History: Past Medical History:  Diagnosis Date   Anxiety 06/20/2020   Aortic atherosclerosis 06/20/2020   Asthma    BPH (benign prostatic hyperplasia) 06/20/2020   Depression    Diabetes mellitus without complication (HCC)    Dyslexia 06/20/2020   Essential hypertension 09/23/2016   Hyperlipemia    Hyperlipidemia 09/23/2016   Hypertension    Poor short-term memory 06/20/2020   Traumatic brain injury (HCC) 06/20/2020   Type 2 diabetes mellitus (HCC) 09/23/2016   Past Surgical History:  Procedure Laterality Date   Arm surgery Right    LEG SURGERY     Family History  Problem Relation Age of Onset   Diabetes Mother    Alzheimer's disease Maternal Grandmother    Hypertension Maternal Grandmother    Colon cancer Neg Hx    Colon polyps Neg Hx    Social History   Socioeconomic History   Marital status: Widowed    Spouse name: Not on file   Number of children: 1   Years of education: 9   Highest education level: Not on file  Occupational History   Occupation: Disability    Comment: Mental health  Tobacco Use   Smoking status: Every Day    Current packs/day: 2.00    Average packs/day: 2.0 packs/day for 43.0 years (86.0 ttl pk-yrs)    Types: Cigarettes    Passive exposure: Never   Smokeless tobacco: Never  Vaping Use   Vaping status: Never Used  Substance and Sexual Activity   Alcohol use: Yes     Alcohol/week: 2.0 standard drinks of alcohol    Types: 1 Cans of beer, 1 Shots of liquor per week    Comment: social   Drug use: No   Sexual activity: Not Currently  Other Topics Concern   Not on file  Social History Narrative   Fun/Hobby: Barrister's clerk.    Social Drivers of Corporate investment banker Strain: Low Risk  (03/03/2024)   Overall Financial Resource Strain (CARDIA)    Difficulty of Paying Living Expenses: Not hard at all  Food Insecurity: No Food Insecurity (03/03/2024)   Hunger Vital Sign    Worried About Running Out of Food in the Last Year: Never true    Ran Out of Food in  the Last Year: Never true  Transportation Needs: No Transportation Needs (03/03/2024)   PRAPARE - Administrator, Civil Service (Medical): No    Lack of Transportation (Non-Medical): No  Physical Activity: Insufficiently Active (03/03/2024)   Exercise Vital Sign    Days of Exercise per Week: 3 days    Minutes of Exercise per Session: 30 min  Stress: No Stress Concern Present (03/03/2024)   [employer] of Occupational Health - Occupational Stress Questionnaire    Feeling of Stress: Not at all  Social Connections: Socially Isolated (03/03/2024)   Social Connection and Isolation Panel    Frequency of Communication with Friends and Family: Once a week    Frequency of Social Gatherings with Friends and Family: Once a week    Attends Religious Services: Never    Database administrator or Organizations: No    Attends Banker Meetings: Never    Marital Status: Widowed    Tobacco Counseling Ready to quit: No Counseling given: Yes    Clinical Intake:  Pre-visit preparation completed: Yes  Pain : No/denies pain     BMI - recorded: 33.09 Nutritional Status: BMI > 30  Obese Nutritional Risks: None Diabetes: Yes CBG done?: No Did pt. bring in CBG monitor from home?: No  Lab Results  Component Value Date   HGBA1C 7.5 (H) 02/08/2024   HGBA1C 7.6 (H)  08/06/2023   HGBA1C 7.2 (H) 02/01/2023     How often do you need to have someone help you when you read instructions, pamphlets, or other written materials from your doctor or pharmacy?: 5 - Always (sister handles)  Interpreter Needed?: No  Information entered by :: Verdie Saba, CMA   Activities of Daily Living     03/03/2024    9:43 AM  In your present state of health, do you have any difficulty performing the following activities:  Hearing? 0  Vision? 0  Difficulty concentrating or making decisions? 0  Walking or climbing stairs? 0  Dressing or bathing? 0  Doing errands, shopping? 0  Preparing Food and eating ? N  Using the Toilet? N  In the past six months, have you accidently leaked urine? N  Do you have problems with loss of bowel control? N  Managing your Medications? N  Managing your Finances? Y  Comment sister Database administrator or managing your Housekeeping? N    Patient Care Team: Norleen Lynwood ORN, MD as PCP - General (Internal Medicine) Octavia Bruckner, MD as Consulting Physician (Ophthalmology) Loreda Hacker, DPM as Consulting Physician (Podiatry)  I have updated your Care Teams any recent Medical Services you may have received from other providers in the past year.     Assessment:   This is a routine wellness examination for [place].  Hearing/Vision screen Hearing Screening - Comments:: Denies hearing difficulties   Vision Screening - Comments:: Wears rx glasses - up to date with routine eye exams with [facility]   Goals Addressed               This Visit's Progress     Patient Stated (pt-stated)        Patient stated he plans to continue walking       Depression Screen     03/03/2024    9:44 AM 02/08/2024    1:35 PM 08/06/2023   10:51 AM 02/23/2023   10:49 AM 01/01/2023   10:57 AM 07/03/2022    9:44 AM 01/26/2022    9:34 AM  PHQ 2/9 Scores  PHQ - 2 Score 0 3 0 2 0 3 0  PHQ- 9 Score 0 8  2  5  0    Fall Risk     03/03/2024     9:43 AM 02/08/2024    1:35 PM 08/06/2023   10:56 AM 02/23/2023   10:49 AM 02/22/2023    9:31 PM  Fall Risk   Falls in the past year? 0 0 0 0 0  Number falls in past yr: 0 0 0 0 0  Injury with Fall? 0 0 0 0 0  Risk for fall due to : No Fall Risks;Impaired balance/gait No Fall Risks No Fall Risks No Fall Risks   Follow up Falls evaluation completed;Falls prevention discussed Falls evaluation completed Falls evaluation completed Falls prevention discussed     MEDICARE RISK AT HOME:  Medicare Risk at Home Any stairs in or around the home?: No (outside) If so, are there any without handrails?: No Home free of loose throw rugs in walkways, pet beds, electrical cords, etc?: Yes Adequate lighting in your home to reduce risk of falls?: Yes Life alert?: No Use of a cane, walker or w/c?: Yes (cane) Grab bars in the bathroom?: Yes Shower chair or bench in shower?: No Elevated toilet seat or a handicapped toilet?: Yes  TIMED UP AND GO:  Was the test performed?  No  Cognitive Function: 6CIT completed    02/23/2023   10:51 AM 12/13/2018    2:47 PM 11/29/2017    9:50 AM  MMSE - Mini Mental State Exam  Not completed: Unable to complete Unable to complete Refused        03/03/2024    9:45 AM 02/23/2023   10:50 AM 01/26/2022    9:37 AM 01/21/2021    6:06 PM  6CIT Screen  What Year? 4 points 0 points 0 points 0 points  What month? 0 points 0 points 0 points 3 points  What time? 0 points 0 points 0 points 0 points  Count back from 20 0 points 0 points 0 points 4 points  Months in reverse 4 points 0 points 0 points 4 points  Repeat phrase 6 points 0 points 0 points 4 points  Total Score 14 points 0 points 0 points 15 points    Immunizations Immunization History  Administered Date(s) Administered   Fluad Quad(high Dose 65+) 06/27/2021, 07/03/2022, 02/27/2023   Influenza,inj,Quad PF,6+ Mos 05/30/2018, 06/20/2020   PFIZER(Purple Top)SARS-COV-2 Vaccination 09/04/2019, 09/20/2019, 04/22/2020,  12/12/2020   PNEUMOCOCCAL CONJUGATE-20 06/27/2021   Pfizer Covid-19 Vaccine Bivalent Booster 51yrs & up 02/27/2023   Pneumococcal Polysaccharide-23 11/29/2017   Tdap 11/29/2017    Screening Tests Health Maintenance  Topic Date Due   Zoster Vaccines- Shingrix (1 of 2) Never done   Lung Cancer Screening  01/27/2020   Colonoscopy  01/29/2022   OPHTHALMOLOGY EXAM  09/29/2023   Influenza Vaccine  12/17/2023   FOOT EXAM  08/05/2024   HEMOGLOBIN A1C  08/07/2024   Diabetic kidney evaluation - eGFR measurement  02/07/2025   Diabetic kidney evaluation - Urine ACR  02/07/2025   Medicare Annual Wellness (AWV)  03/03/2025   DTaP/Tdap/Td (2 - Td or Tdap) 11/30/2027   Pneumococcal Vaccine: 50+ Years  Completed   Hepatitis C Screening  Completed   Meningococcal B Vaccine  Aged Out   COVID-19 Vaccine  Discontinued    Health Maintenance Items Addressed:  Referral sent for Low Dose Chest CT (smoker/hx smoking) - Current Smoker  Additional Screening:  Vision Screening: Recommended annual ophthalmology exams for early detection of glaucoma and other disorders of the eye. Is the patient up to date with their annual eye exam?  Yes  Who is the provider or what is the name of the office in which the patient attends annual eye exams? Lonni Gaudy of [facility]  Dental Screening: Recommended annual dental exams for proper oral hygiene  Community Resource Referral / Chronic Care Management: CRR required this visit?  No   CCM required this visit?  No   Plan:    I have personally reviewed and noted the following in the patient's chart:   Medical and social history Use of alcohol, tobacco or illicit drugs  Current medications and supplements including opioid prescriptions. Patient is not currently taking opioid prescriptions. Functional ability and status Nutritional status Physical activity Advanced directives List of other physicians Hospitalizations, surgeries, and ER visits in  previous 12 months Vitals Screenings to include cognitive, depression, and falls Referrals and appointments  In addition, I have reviewed and discussed with patient certain preventive protocols, quality metrics, and best practice recommendations. A written personalized care plan for preventive services as well as general preventive health recommendations were provided to patient.   Verdie CHRISTELLA Saba, CMA   03/03/2024   After Visit Summary: (MyChart) Due to this being a telephonic visit, the after visit summary with patients personalized plan was offered to patient via MyChart   Notes: Appt w/PCP in 07/2024.  Cognitive Test results > 14pts.

## 2024-03-03 NOTE — Patient Instructions (Addendum)
 Benjamin Serrano,  Thank you for taking the time for your Medicare Wellness Visit. I appreciate your continued commitment to your health goals. Please review the care plan we discussed, and feel free to reach out if I can assist you further.  Medicare recommends these wellness visits once per year to help you and your care team stay ahead of potential health issues. These visits are designed to focus on prevention, allowing your provider to concentrate on managing your acute and chronic conditions during your regular appointments.  Please note that Annual Wellness Visits do not include a physical exam. Some assessments may be limited, especially if the visit was conducted virtually. If needed, we may recommend a separate in-person follow-up with your provider.  Ongoing Care Seeing your primary care provider every 3 to 6 months helps us  monitor your health and provide consistent, personalized care.   Referrals If a referral was made during today's visit and you haven't received any updates within two weeks, please contact the referred provider directly to check on the status.  Recommended Screenings:  Health Maintenance  Topic Date Due   Zoster (Shingles) Vaccine (1 of 2) Never done   Screening for Lung Cancer  01/27/2020   Colon Cancer Screening  01/29/2022   Eye exam for diabetics  09/29/2023   Flu Shot  12/17/2023   Medicare Annual Wellness Visit  02/23/2024   Complete foot exam   08/05/2024   Hemoglobin A1C  08/07/2024   Yearly kidney function blood test for diabetes  02/07/2025   Yearly kidney health urinalysis for diabetes  02/07/2025   DTaP/Tdap/Td vaccine (2 - Td or Tdap) 11/30/2027   Pneumococcal Vaccine for age over 77  Completed   Hepatitis C Screening  Completed   Meningitis B Vaccine  Aged Out   COVID-19 Vaccine  Discontinued       03/03/2024    9:39 AM  Advanced Directives  Does Patient Have a Medical Advance Directive? No  Would patient like information on creating a  medical advance directive? No - Patient declined   Advance Care Planning is important because it: Ensures you receive medical care that aligns with your values, goals, and preferences. Provides guidance to your family and loved ones, reducing the emotional burden of decision-making during critical moments.  Vision: Annual vision screenings are recommended for early detection of glaucoma, cataracts, and diabetic retinopathy. These exams can also reveal signs of chronic conditions such as diabetes and high blood pressure.  Dental: Annual dental screenings help detect early signs of oral cancer, gum disease, and other conditions linked to overall health, including heart disease and diabetes.

## 2024-03-16 ENCOUNTER — Telehealth: Payer: Self-pay

## 2024-03-16 ENCOUNTER — Other Ambulatory Visit: Payer: Self-pay

## 2024-03-16 MED ORDER — ACCU-CHEK GUIDE TEST VI STRP: ORAL_STRIP | 12 refills | Status: DC

## 2024-03-16 NOTE — Telephone Encounter (Signed)
 Strips have been sent to the pharmacy.

## 2024-03-16 NOTE — Telephone Encounter (Signed)
 Copied from CRM [id number]. Topic: Clinical - Prescription Issue >> Mar 16, 2024  2:19 PM Harlene ORN wrote: Reason for CRM: [facility]  Pharmacy received an order for a Accu Check meter and lancets on 10/09, but not the strips.  Phone: [phone]

## 2024-04-21 ENCOUNTER — Other Ambulatory Visit: Payer: Self-pay | Admitting: Internal Medicine

## 2024-04-26 ENCOUNTER — Encounter: Payer: Self-pay | Admitting: Internal Medicine

## 2024-05-02 ENCOUNTER — Other Ambulatory Visit: Payer: Self-pay

## 2024-05-02 DIAGNOSIS — I10 Essential (primary) hypertension: Secondary | ICD-10-CM

## 2024-05-02 DIAGNOSIS — E119 Type 2 diabetes mellitus without complications: Secondary | ICD-10-CM

## 2024-05-02 MED ORDER — BUPROPION HCL ER (XL) 300 MG PO TB24: 300.0000 mg | ORAL_TABLET | Freq: Every day | ORAL | 3 refills | Status: AC

## 2024-05-02 MED ORDER — FREESTYLE LIBRE 3 PLUS SENSOR MISC: 3 refills | Status: AC

## 2024-05-02 MED ORDER — ROSUVASTATIN CALCIUM 20 MG PO TABS: 20.0000 mg | ORAL_TABLET | Freq: Every day | ORAL | 11 refills | Status: AC

## 2024-05-02 MED ORDER — ALFUZOSIN HCL ER 10 MG PO TB24: 10.0000 mg | ORAL_TABLET | Freq: Every day | ORAL | 11 refills | Status: AC

## 2024-05-02 MED ORDER — AMLODIPINE BESYLATE 10 MG PO TABS: 10.0000 mg | ORAL_TABLET | Freq: Every day | ORAL | 11 refills | Status: AC

## 2024-05-02 MED ORDER — VARENICLINE TARTRATE 1 MG PO TABS: 1.0000 mg | ORAL_TABLET | Freq: Two times a day (BID) | ORAL | 0 refills | Status: AC

## 2024-05-02 MED ORDER — DAPAGLIFLOZIN PROPANEDIOL 10 MG PO TABS: 10.0000 mg | ORAL_TABLET | Freq: Every day | ORAL | 11 refills | Status: AC

## 2024-05-02 MED ORDER — METFORMIN HCL 1000 MG PO TABS: 1000.0000 mg | ORAL_TABLET | Freq: Two times a day (BID) | ORAL | 11 refills | Status: AC

## 2024-05-02 MED ORDER — SILDENAFIL CITRATE 100 MG PO TABS: 50.0000 mg | ORAL_TABLET | Freq: Every day | ORAL | 11 refills | Status: AC | PRN

## 2024-05-02 MED ORDER — ACCU-CHEK GUIDE TEST VI STRP: ORAL_STRIP | 12 refills | Status: AC

## 2024-05-02 MED ORDER — GLIPIZIDE ER 10 MG PO TB24: 10.0000 mg | ORAL_TABLET | Freq: Every day | ORAL | 11 refills | Status: AC

## 2024-05-02 MED ORDER — FINASTERIDE 5 MG PO TABS: 5.0000 mg | ORAL_TABLET | Freq: Every day | ORAL | 11 refills | Status: AC

## 2024-05-02 MED ORDER — ACCU-CHEK SOFTCLIX LANCETS MISC: 12 refills | Status: AC

## 2024-05-03 ENCOUNTER — Other Ambulatory Visit (HOSPITAL_COMMUNITY): Payer: Self-pay

## 2024-05-04 ENCOUNTER — Other Ambulatory Visit (HOSPITAL_COMMUNITY): Payer: Self-pay

## 2024-05-05 ENCOUNTER — Telehealth: Payer: Self-pay

## 2024-05-05 NOTE — Telephone Encounter (Signed)
 Sorry, I am unable to assist with this documentation specifically, and pt does not take insulin.   Thanks

## 2024-05-05 NOTE — Telephone Encounter (Signed)
 To help increase the likelihood of successful prior authorization for a continuous glucose monitor, please review the documentation and include evidence of at least two hypoglycemic events (insurers often require glucose values <=54 mg/dL) and/or clear documentation that the patient relies on insulin to adequately control their diabetes. Thank you for your partnership.

## 2024-05-08 NOTE — Telephone Encounter (Signed)
 Ok to let pt know this, and let me know if he needs refill for the supplies   thanks

## 2024-05-08 NOTE — Telephone Encounter (Signed)
 Without the documentation of the patient using insulin or glucose values less than or equal to 54, it would be best for the patient to use the lancets and strips. If the patient uses a blood glucose log please have them to turn it in an if there are low reading we could submit a prior authorization for the CGM system.

## 2024-08-11 ENCOUNTER — Ambulatory Visit: Admitting: Internal Medicine

## 2025-03-08 ENCOUNTER — Encounter: Admitting: Internal Medicine

## 2025-03-08 ENCOUNTER — Ambulatory Visit
